# Patient Record
Sex: Male | Born: 1945 | Race: White | Hispanic: No | Marital: Married | Smoking: Former smoker
Health system: Southern US, Community
[De-identification: ages and names within clinical notes are randomized; demographics above are authoritative.]

## PROBLEM LIST (undated history)

## (undated) DIAGNOSIS — Z8619 Personal history of other infectious and parasitic diseases: Secondary | ICD-10-CM

## (undated) DIAGNOSIS — T753XXA Motion sickness, initial encounter: Secondary | ICD-10-CM

## (undated) DIAGNOSIS — H409 Unspecified glaucoma: Secondary | ICD-10-CM

## (undated) DIAGNOSIS — N400 Enlarged prostate without lower urinary tract symptoms: Secondary | ICD-10-CM

## (undated) DIAGNOSIS — B029 Zoster without complications: Secondary | ICD-10-CM

## (undated) DIAGNOSIS — F329 Major depressive disorder, single episode, unspecified: Secondary | ICD-10-CM

## (undated) DIAGNOSIS — E785 Hyperlipidemia, unspecified: Secondary | ICD-10-CM

## (undated) DIAGNOSIS — E1142 Type 2 diabetes mellitus with diabetic polyneuropathy: Secondary | ICD-10-CM

## (undated) DIAGNOSIS — F32A Depression, unspecified: Secondary | ICD-10-CM

## (undated) DIAGNOSIS — M48061 Spinal stenosis, lumbar region without neurogenic claudication: Secondary | ICD-10-CM

## (undated) DIAGNOSIS — Z8659 Personal history of other mental and behavioral disorders: Secondary | ICD-10-CM

## (undated) DIAGNOSIS — I10 Essential (primary) hypertension: Secondary | ICD-10-CM

## (undated) DIAGNOSIS — M5030 Other cervical disc degeneration, unspecified cervical region: Secondary | ICD-10-CM

## (undated) DIAGNOSIS — E669 Obesity, unspecified: Secondary | ICD-10-CM

## (undated) DIAGNOSIS — M5416 Radiculopathy, lumbar region: Secondary | ICD-10-CM

## (undated) DIAGNOSIS — G4733 Obstructive sleep apnea (adult) (pediatric): Secondary | ICD-10-CM

## (undated) DIAGNOSIS — E119 Type 2 diabetes mellitus without complications: Secondary | ICD-10-CM

## (undated) DIAGNOSIS — J309 Allergic rhinitis, unspecified: Secondary | ICD-10-CM

## (undated) DIAGNOSIS — M5104 Intervertebral disc disorders with myelopathy, thoracic region: Secondary | ICD-10-CM

## (undated) DIAGNOSIS — Z9989 Dependence on other enabling machines and devices: Secondary | ICD-10-CM

## (undated) HISTORY — PX: TONSILLECTOMY: SUR1361

---

## 2005-08-17 ENCOUNTER — Ambulatory Visit: Payer: Self-pay | Admitting: Unknown Physician Specialty

## 2009-11-05 ENCOUNTER — Ambulatory Visit: Payer: Self-pay

## 2009-12-20 ENCOUNTER — Encounter
Admission: RE | Admit: 2009-12-20 | Discharge: 2009-12-20 | Payer: Self-pay | Source: Home / Self Care | Attending: Neurosurgery | Admitting: Neurosurgery

## 2010-01-12 HISTORY — PX: POSTERIOR CERVICAL FUSION/FORAMINOTOMY: SHX5038

## 2010-01-12 HISTORY — PX: CERVICAL FUSION: SHX112

## 2010-02-19 ENCOUNTER — Ambulatory Visit
Admission: RE | Admit: 2010-02-19 | Discharge: 2010-02-19 | Disposition: A | Discharge status: Home / Self Care | Payer: BC Managed Care – PPO | Source: Ambulatory Visit | Attending: Neurosurgery | Admitting: Neurosurgery

## 2010-02-19 ENCOUNTER — Other Ambulatory Visit: Payer: Self-pay | Admitting: Neurosurgery

## 2010-02-19 DIAGNOSIS — Z9889 Other specified postprocedural states: Secondary | ICD-10-CM

## 2012-06-27 ENCOUNTER — Ambulatory Visit: Payer: Self-pay | Admitting: Unknown Physician Specialty

## 2012-06-28 LAB — PATHOLOGY REPORT

## 2014-06-14 ENCOUNTER — Ambulatory Visit: Payer: Medicare PPO | Attending: Neurology

## 2014-06-14 DIAGNOSIS — G4733 Obstructive sleep apnea (adult) (pediatric): Secondary | ICD-10-CM | POA: Insufficient documentation

## 2014-06-14 DIAGNOSIS — G4761 Periodic limb movement disorder: Secondary | ICD-10-CM | POA: Diagnosis not present

## 2014-06-27 ENCOUNTER — Other Ambulatory Visit: Payer: Self-pay | Admitting: Physical Medicine and Rehabilitation

## 2014-06-27 DIAGNOSIS — M5416 Radiculopathy, lumbar region: Secondary | ICD-10-CM

## 2014-07-06 ENCOUNTER — Ambulatory Visit
Admission: RE | Admit: 2014-07-06 | Discharge: 2014-07-06 | Disposition: A | Discharge status: Home / Self Care | Payer: Medicare PPO | Source: Ambulatory Visit | Attending: Physical Medicine and Rehabilitation | Admitting: Physical Medicine and Rehabilitation

## 2014-07-06 DIAGNOSIS — M5416 Radiculopathy, lumbar region: Secondary | ICD-10-CM | POA: Diagnosis present

## 2015-06-28 ENCOUNTER — Encounter: Payer: Self-pay | Admitting: *Deleted

## 2015-07-01 ENCOUNTER — Ambulatory Visit: Payer: Medicare Other | Admitting: Anesthesiology

## 2015-07-01 ENCOUNTER — Encounter
Admission: RE | Disposition: A | Discharge status: Home / Self Care | Payer: Self-pay | Source: Ambulatory Visit | Attending: Unknown Physician Specialty

## 2015-07-01 ENCOUNTER — Encounter: Payer: Self-pay | Admitting: Anesthesiology

## 2015-07-01 ENCOUNTER — Ambulatory Visit
Admission: RE | Admit: 2015-07-01 | Discharge: 2015-07-01 | Disposition: A | Discharge status: Home / Self Care | Payer: Medicare Other | Source: Ambulatory Visit | Attending: Unknown Physician Specialty | Admitting: Unknown Physician Specialty

## 2015-07-01 DIAGNOSIS — G4730 Sleep apnea, unspecified: Secondary | ICD-10-CM | POA: Diagnosis not present

## 2015-07-01 DIAGNOSIS — E669 Obesity, unspecified: Secondary | ICD-10-CM | POA: Diagnosis not present

## 2015-07-01 DIAGNOSIS — E785 Hyperlipidemia, unspecified: Secondary | ICD-10-CM | POA: Diagnosis not present

## 2015-07-01 DIAGNOSIS — F329 Major depressive disorder, single episode, unspecified: Secondary | ICD-10-CM | POA: Diagnosis not present

## 2015-07-01 DIAGNOSIS — E1142 Type 2 diabetes mellitus with diabetic polyneuropathy: Secondary | ICD-10-CM | POA: Diagnosis not present

## 2015-07-01 DIAGNOSIS — K621 Rectal polyp: Secondary | ICD-10-CM | POA: Diagnosis not present

## 2015-07-01 DIAGNOSIS — Z6837 Body mass index (BMI) 37.0-37.9, adult: Secondary | ICD-10-CM | POA: Insufficient documentation

## 2015-07-01 DIAGNOSIS — Z79899 Other long term (current) drug therapy: Secondary | ICD-10-CM | POA: Insufficient documentation

## 2015-07-01 DIAGNOSIS — Z7982 Long term (current) use of aspirin: Secondary | ICD-10-CM | POA: Diagnosis not present

## 2015-07-01 DIAGNOSIS — I10 Essential (primary) hypertension: Secondary | ICD-10-CM | POA: Insufficient documentation

## 2015-07-01 DIAGNOSIS — Z1211 Encounter for screening for malignant neoplasm of colon: Secondary | ICD-10-CM | POA: Insufficient documentation

## 2015-07-01 DIAGNOSIS — K648 Other hemorrhoids: Secondary | ICD-10-CM | POA: Insufficient documentation

## 2015-07-01 DIAGNOSIS — M1990 Unspecified osteoarthritis, unspecified site: Secondary | ICD-10-CM | POA: Diagnosis not present

## 2015-07-01 DIAGNOSIS — Z7984 Long term (current) use of oral hypoglycemic drugs: Secondary | ICD-10-CM | POA: Insufficient documentation

## 2015-07-01 DIAGNOSIS — D123 Benign neoplasm of transverse colon: Secondary | ICD-10-CM | POA: Diagnosis not present

## 2015-07-01 DIAGNOSIS — K5730 Diverticulosis of large intestine without perforation or abscess without bleeding: Secondary | ICD-10-CM | POA: Diagnosis not present

## 2015-07-01 HISTORY — DX: Essential (primary) hypertension: I10

## 2015-07-01 HISTORY — DX: Spinal stenosis, lumbar region without neurogenic claudication: M48.061

## 2015-07-01 HISTORY — DX: Type 2 diabetes mellitus without complications: E11.9

## 2015-07-01 HISTORY — DX: Obstructive sleep apnea (adult) (pediatric): G47.33

## 2015-07-01 HISTORY — DX: Dependence on other enabling machines and devices: Z99.89

## 2015-07-01 HISTORY — DX: Major depressive disorder, single episode, unspecified: F32.9

## 2015-07-01 HISTORY — DX: Benign prostatic hyperplasia without lower urinary tract symptoms: N40.0

## 2015-07-01 HISTORY — DX: Unspecified glaucoma: H40.9

## 2015-07-01 HISTORY — DX: Personal history of other infectious and parasitic diseases: Z86.19

## 2015-07-01 HISTORY — DX: Depression, unspecified: F32.A

## 2015-07-01 HISTORY — DX: Obesity, unspecified: E66.9

## 2015-07-01 HISTORY — DX: Type 2 diabetes mellitus with diabetic polyneuropathy: E11.42

## 2015-07-01 HISTORY — PX: COLONOSCOPY WITH PROPOFOL: SHX5780

## 2015-07-01 HISTORY — DX: Radiculopathy, lumbar region: M54.16

## 2015-07-01 HISTORY — DX: Zoster without complications: B02.9

## 2015-07-01 HISTORY — DX: Allergic rhinitis, unspecified: J30.9

## 2015-07-01 HISTORY — DX: Hyperlipidemia, unspecified: E78.5

## 2015-07-01 HISTORY — DX: Other cervical disc degeneration, unspecified cervical region: M50.30

## 2015-07-01 HISTORY — DX: Intervertebral disc disorders with myelopathy, thoracic region: M51.04

## 2015-07-01 LAB — GLUCOSE, CAPILLARY: Glucose-Capillary: 152 mg/dL — ABNORMAL HIGH (ref 65–99)

## 2015-07-01 SURGERY — COLONOSCOPY WITH PROPOFOL
Anesthesia: General

## 2015-07-01 MED ORDER — FENTANYL CITRATE (PF) 100 MCG/2ML IJ SOLN
INTRAMUSCULAR | Status: DC | PRN
  Administered 2015-07-01: 50 ug via INTRAVENOUS

## 2015-07-01 MED ORDER — PROPOFOL 500 MG/50ML IV EMUL
INTRAVENOUS | Status: DC | PRN
  Administered 2015-07-01: 75 ug/kg/min via INTRAVENOUS

## 2015-07-01 MED ORDER — PROPOFOL 10 MG/ML IV BOLUS
INTRAVENOUS | Status: DC | PRN
  Administered 2015-07-01: 30 mg via INTRAVENOUS
  Administered 2015-07-01: 20 mg via INTRAVENOUS
  Administered 2015-07-01: 10 mg via INTRAVENOUS
  Administered 2015-07-01: 20 mg via INTRAVENOUS

## 2015-07-01 MED ORDER — MIDAZOLAM HCL 5 MG/5ML IJ SOLN
INTRAMUSCULAR | Status: DC | PRN
  Administered 2015-07-01: 1 mg via INTRAVENOUS

## 2015-07-01 MED ORDER — SODIUM CHLORIDE 0.9 % IV SOLN
INTRAVENOUS | Status: DC
  Administered 2015-07-01: 1000 mL via INTRAVENOUS

## 2015-07-01 MED ORDER — LIDOCAINE HCL (PF) 2 % IJ SOLN
INTRAMUSCULAR | Status: DC | PRN
  Administered 2015-07-01: 50 mg

## 2015-07-01 MED ORDER — SODIUM CHLORIDE 0.9 % IV SOLN: INTRAVENOUS | Status: DC

## 2015-07-01 NOTE — Anesthesia Preprocedure Evaluation (Signed)
Anesthesia Evaluation  Patient identified by MRN, date of birth, ID band Patient awake    Reviewed: Allergy & Precautions, NPO status , Patient's Chart, lab work & pertinent test results, reviewed documented beta blocker date and time   Airway Mallampati: III  TM Distance: >3 FB     Dental  (+) Chipped   Pulmonary sleep apnea and Continuous Positive Airway Pressure Ventilation ,           Cardiovascular hypertension, Pt. on medications      Neuro/Psych PSYCHIATRIC DISORDERS Depression  Neuromuscular disease    GI/Hepatic   Endo/Other  diabetes, Type 2  Renal/GU      Musculoskeletal  (+) Arthritis ,   Abdominal   Peds  Hematology   Anesthesia Other Findings Obese.  Reproductive/Obstetrics                             Anesthesia Physical Anesthesia Plan  ASA: III  Anesthesia Plan: General   Post-op Pain Management:    Induction: Intravenous  Airway Management Planned: Nasal Cannula  Additional Equipment:   Intra-op Plan:   Post-operative Plan:   Informed Consent: I have reviewed the patients History and Physical, chart, labs and discussed the procedure including the risks, benefits and alternatives for the proposed anesthesia with the patient or authorized representative who has indicated his/her understanding and acceptance.     Plan Discussed with: CRNA  Anesthesia Plan Comments:         Anesthesia Quick Evaluation

## 2015-07-01 NOTE — Anesthesia Postprocedure Evaluation (Signed)
Anesthesia Post Note  Patient: Miguel White  Procedure(s) Performed: Procedure(s) (LRB): COLONOSCOPY WITH PROPOFOL (N/A)  Patient location during evaluation: Endoscopy Anesthesia Type: General Level of consciousness: awake and alert Pain management: pain level controlled Vital Signs Assessment: post-procedure vital signs reviewed and stable Respiratory status: spontaneous breathing, nonlabored ventilation, respiratory function stable and patient connected to nasal cannula oxygen Cardiovascular status: blood pressure returned to baseline and stable Postop Assessment: no signs of nausea or vomiting Anesthetic complications: no    Last Vitals:  Filed Vitals:   07/01/15 0830 07/01/15 0840  BP: 119/61 123/66  Pulse: 67 65  Temp:    Resp: 24 15    Last Pain:  Filed Vitals:   07/01/15 0843  PainSc: 2                  Doyle Kunath S

## 2015-07-01 NOTE — H&P (Signed)
Primary Care Physician:  Marcello Fennel, MD Primary Gastroenterologist:  Dr. Vira Agar  Pre-Procedure History & Physical: HPI:  Miguel White is a 70 y.o. male is here for an colonoscopy.   Past Medical History  Diagnosis Date  . Hypertension   . Hypertrophy of prostate without urinary obstruction   . Hyperlipidemia   . Depressive disorder   . Diabetes mellitus without complication ([place])   . Diabetic peripheral neuropathy ([place])   . Allergic rhinitis   . OSA on CPAP   . Lumbar radiculitis   . DDD (degenerative disc disease), cervical   . Lumbar stenosis   . HNP (herniated nucleus pulposus with myelopathy), thoracic   . Obesity   . History of chickenpox   . Glaucoma   . Shingles     History reviewed. No pertinent past surgical history.  Prior to Admission medications   Medication Sig Start Date End Date Taking? Authorizing Provider  amLODipine-benazepril (LOTREL) 10-20 MG capsule Take 1 capsule by mouth daily.   Yes Historical Provider, MD  aspirin 81 MG tablet Take 81 mg by mouth daily.   Yes Historical Provider, MD  citalopram (CELEXA) 20 MG tablet Take 20 mg by mouth daily.   Yes Historical Provider, MD  ezetimibe-simvastatin (VYTORIN) 10-20 MG tablet Take 1 tablet by mouth at bedtime.   Yes Historical Provider, MD  fluticasone (FLONASE) 50 MCG/ACT nasal spray Place 2 sprays into both nostrils daily.   Yes Historical Provider, MD  gabapentin (NEURONTIN) 600 MG tablet Take 600 mg by mouth at bedtime.   Yes Historical Provider, MD  glimepiride (AMARYL) 4 MG tablet Take 4 mg by mouth daily with breakfast.   Yes Historical Provider, MD  hydrochlorothiazide (HYDRODIURIL) 25 MG tablet Take 25 mg by mouth daily.   Yes Historical Provider, MD  latanoprost (XALATAN) 0.005 % ophthalmic solution 1 drop at bedtime.   Yes Historical Provider, MD  metformin (FORTAMET) 1000 MG (OSM) 24 hr tablet Take 1,000 mg by mouth 2 (two) times daily with a meal.   Yes Historical Provider, MD   Multiple Vitamin (MULTIVITAMIN) tablet Take 1 tablet by mouth daily.   Yes Historical Provider, MD  oxyCODONE-acetaminophen (PERCOCET/ROXICET) 5-325 MG tablet Take 1 tablet by mouth 2 (two) times daily as needed for severe pain.   Yes Historical Provider, MD  sitaGLIPtin (JANUVIA) 100 MG tablet Take 100 mg by mouth daily.   Yes Historical Provider, MD  tamsulosin (FLOMAX) 0.4 MG CAPS capsule Take 0.4 mg by mouth.   Yes Historical Provider, MD  timolol (TIMOPTIC) 0.5 % ophthalmic solution 1 drop 2 (two) times daily.   Yes Historical Provider, MD  triamcinolone cream (KENALOG) 0.1 % Apply 1 application topically 4 (four) times daily.   Yes Historical Provider, MD    Allergies as of 06/13/2015  . (Not on File)    History reviewed. No pertinent family history.  Social History   Social History  . Marital Status: Unknown    Spouse Name: N/A  . Number of Children: N/A  . Years of Education: N/A   Occupational History  . Not on file.   Social History Main Topics  . Smoking status: Not on file  . Smokeless tobacco: Not on file  . Alcohol Use: Not on file  . Drug Use: Not on file  . Sexual Activity: Not on file   Other Topics Concern  . Not on file   Social History Narrative    Review of Systems: See HPI, otherwise negative  ROS  Physical Exam: BP 135/65 mmHg  Pulse 64  Temp(Src) 97.6 F (36.4 C) (Tympanic)  Resp 16  Ht 5\' 6"  (1.676 m)  Wt 106.595 kg (235 lb)  BMI 37.95 kg/m2  SpO2 100% General:   Alert,  pleasant and cooperative in NAD Head:  Normocephalic and atraumatic. Neck:  Supple; no masses or thyromegaly. Lungs:  Clear throughout to auscultation.    Heart:  Regular rate and rhythm. Abdomen:  Soft, nontender and nondistended. Normal bowel sounds, without guarding, and without rebound.   Neurologic:  Alert and  oriented x4;  grossly normal neurologically.  Impression/Plan: Miguel White is here for an colonoscopy to be performed for [facility] colon polyps  Risks,  benefits, limitations, and alternatives regarding  colonoscopy have been reviewed with the patient.  Questions have been answered.  All parties agreeable.   Gaylyn Cheers, MD  07/01/2015, 7:28 AM

## 2015-07-01 NOTE — Op Note (Addendum)
[facility] Gastroenterology Patient Name: Miguel White Procedure Date: 07/01/2015 7:35 AM MRN: [id number] Account #: [id number] Date of Birth: 01-24-1945 Admit Type: Outpatient Age: 70 Room: [facility] ENDO ROOM 1 Gender: Male Note Status: Finalized Procedure:            Colonoscopy Indications:          High risk colon cancer surveillance: Personal history                        of colonic polyps Providers:            Manya Silvas, MD Referring MD:         Marga Hoots NP (Referring MD) Medicines:            Propofol per Anesthesia Complications:        No immediate complications. Procedure:            Pre-Anesthesia Assessment:                       - After reviewing the risks and benefits, the patient                        was deemed in satisfactory condition to undergo the                        procedure.                       After obtaining informed consent, the colonoscope was                        passed under direct vision. Throughout the procedure,                        the patient's blood pressure, pulse, and oxygen                        saturations were monitored continuously. The                        Colonoscope was introduced through the anus and                        advanced to the the cecum, identified by appendiceal                        orifice and ileocecal valve. The colonoscopy was                        performed without difficulty. The patient tolerated the                        procedure well. The quality of the bowel preparation                        was good. Findings:      Two sessile polyps were found in the rectum and transverse colon. The       polyps were diminutive in size. These polyps were removed with a jumbo       cold forceps. Resection and retrieval were complete.  Multiple small-mouthed diverticula were found in the sigmoid colon.      Internal hemorrhoids were found during endoscopy. The hemorrhoids  were       small and Grade I (internal hemorrhoids that do not prolapse).      The exam was otherwise without abnormality. Impression:           - Two diminutive polyps in the rectum and in the                        transverse colon, removed with a jumbo cold forceps.                        Resected and retrieved.                       - Diverticulosis in the sigmoid colon.                       - Internal hemorrhoids.                       - The examination was otherwise normal. Recommendation:       - Await pathology results. Manya Silvas, MD 07/01/2015 8:10:06 AM This report has been signed electronically. Number of Addenda: 0 Note Initiated On: 07/01/2015 7:35 AM Scope Withdrawal Time: 0 hours 20 minutes 30 seconds  Total Procedure Duration: 0 hours 28 minutes 59 seconds       [facility]

## 2015-07-01 NOTE — Transfer of Care (Signed)
Immediate Anesthesia Transfer of Care Note  Patient: Miguel White  Procedure(s) Performed: Procedure(s): COLONOSCOPY WITH PROPOFOL (N/A)  Patient Location: PACU  Anesthesia Type:General  Level of Consciousness: sedated  Airway & Oxygen Therapy: Patient Spontanous Breathing and Patient connected to nasal cannula oxygen  Post-op Assessment: Report given to RN and Post -op Vital signs reviewed and stable  Post vital signs: Reviewed and stable  Last Vitals:  Filed Vitals:   07/01/15 0700  BP: 135/65  Pulse: 64  Temp: 36.4 C  Resp: 16    Last Pain:  Filed Vitals:   07/01/15 0703  PainSc: 2          Complications: No apparent anesthesia complications

## 2015-07-02 ENCOUNTER — Encounter: Payer: Self-pay | Admitting: Unknown Physician Specialty

## 2015-07-02 LAB — SURGICAL PATHOLOGY

## 2015-10-31 ENCOUNTER — Other Ambulatory Visit: Payer: Self-pay | Admitting: Family Medicine

## 2015-10-31 DIAGNOSIS — M5416 Radiculopathy, lumbar region: Secondary | ICD-10-CM

## 2015-11-19 ENCOUNTER — Ambulatory Visit: Admission: RE | Admit: 2015-11-19 | Payer: Medicare Other | Source: Ambulatory Visit

## 2015-11-21 ENCOUNTER — Ambulatory Visit
Admission: RE | Admit: 2015-11-21 | Discharge: 2015-11-21 | Disposition: A | Discharge status: Home / Self Care | Payer: Medicare Other | Source: Ambulatory Visit | Attending: Family Medicine | Admitting: Family Medicine

## 2015-11-21 DIAGNOSIS — M5416 Radiculopathy, lumbar region: Secondary | ICD-10-CM | POA: Insufficient documentation

## 2015-11-21 DIAGNOSIS — M4306 Spondylolysis, lumbar region: Secondary | ICD-10-CM | POA: Insufficient documentation

## 2015-11-21 DIAGNOSIS — M48061 Spinal stenosis, lumbar region without neurogenic claudication: Secondary | ICD-10-CM | POA: Diagnosis not present

## 2015-11-21 DIAGNOSIS — M5126 Other intervertebral disc displacement, lumbar region: Secondary | ICD-10-CM | POA: Diagnosis not present

## 2015-12-10 ENCOUNTER — Other Ambulatory Visit: Payer: Self-pay | Admitting: Neurosurgery

## 2016-01-03 NOTE — Pre-Procedure Instructions (Signed)
Miguel White  01/03/2016      MEDICAL [address] [place], [name] - [name] [address] Phone: [phone] Fax: [phone]    Your procedure is scheduled on Jan. 8  Report to [facility] at 900  Call this number if you have problems the morning of surgery:  [phone]   Remember:  Do not eat food or drink liquids after midnight.  Take these medicines the morning of surgery with A SIP OF WATER Citalopram (Celexa), Gabapentin (Neurontin), Tamsulosin (Flomax)  Stop taking aspirin, BC's, Goody's, Herbal medications, Fish Oil, Ibuprofen, Advil, Motrin, Aleve, vitamins    How to Manage Your Diabetes Before and After Surgery  Why is it important to control my blood sugar before and after surgery? . Improving blood sugar levels before and after surgery helps healing and can limit problems. . A way of improving blood sugar control is eating a healthy diet by: o  Eating less sugar and carbohydrates o  Increasing activity/exercise o  Talking with your doctor about reaching your blood sugar goals . High blood sugars (greater than 180 mg/dL) can raise your risk of infections and slow your recovery, so you will need to focus on controlling your diabetes during the weeks before surgery. . Make sure that the doctor who takes care of your diabetes knows about your planned surgery including the date and location.  How do I manage my blood sugar before surgery? . Check your blood sugar at least 4 times a day, starting 2 days before surgery, to make sure that the level is not too high or low. o Check your blood sugar the morning of your surgery when you wake up and every 2 hours until you get to the Short Stay unit. . If your blood sugar is less than 70 mg/dL, you will need to treat for low blood sugar: o Do not take insulin. o Treat a low blood sugar (less than 70 mg/dL) with  cup of clear juice (cranberry or apple), 4  glucose tablets, OR glucose gel. o Recheck blood sugar in 15 minutes after treatment (to make sure it is greater than 70 mg/dL). If your blood sugar is not greater than 70 mg/dL on recheck, call [phone] for further instructions. . Report your blood sugar to the short stay nurse when you get to Short Stay.  . If you are admitted to the hospital after surgery: o Your blood sugar will be checked by the staff and you will probably be given insulin after surgery (instead of oral diabetes medicines) to make sure you have good blood sugar levels. o The goal for blood sugar control after surgery is 80-180 mg/dL.              WHAT DO I DO ABOUT MY DIABETES MEDICATION?   [name] Do not take oral diabetes medicines (pills) the morning of surgery. Glimepiride (Amaryl), metformin (Fortamet), and Sitaglptin (Januvia)  . THE NIGHT BEFORE SURGERY, take 37  units of Lantus insulin.       [name] HE MORNING OF SURGERY, take _____________ units of __________insulin.  . The day of surgery, do not take other diabetes injectables, including Byetta (exenatide), Bydureon (exenatide ER), Victoza (liraglutide), or Trulicity (dulaglutide).  . If your CBG is greater than 220 mg/dL, you may take  of your sliding scale (correction) dose of insulin.  Other Instructions:          Patient Signature:  Date:  Nurse Signature:  Date:   Reviewed and Endorsed by [facility] Patient Education Committee, August 2015  Do not wear jewelry, make-up or nail polish.  Do not wear lotions, powders, or perfumes, or deoderant.  Do not shave 48 hours prior to surgery.  Men may shave face and neck.  Do not bring valuables to the hospital.  [facility] is not responsible for any belongings or valuables.  Contacts, dentures or bridgework may not be worn into surgery.  Leave your suitcase in the car.  After surgery it may be brought to your room.  For patients admitted to the hospital, discharge time will be determined by  your treatment team.  Patients discharged the day of surgery will not be allowed to drive home.   Special instructions:  [name] - Preparing for Surgery  Before surgery, you can play an important role.  Because skin is not sterile, your skin needs to be as free of germs as possible.  You can reduce the number of germs on you skin by washing with CHG (chlorahexidine gluconate) soap before surgery.  CHG is an antiseptic cleaner which kills germs and bonds with the skin to continue killing germs even after washing.  Please DO NOT use if you have an allergy to CHG or antibacterial soaps.  If your skin becomes reddened/irritated stop using the CHG and inform your nurse when you arrive at Short Stay.  Do not shave (including legs and underarms) for at least 48 hours prior to the first CHG shower.  You may shave your face.  Please follow these instructions carefully:   1.  Shower with CHG Soap the night before surgery and the    morning of Surgery.  2.  If you choose to wash your hair, wash your hair first as usual with your  normal shampoo.  3.  After you shampoo, rinse your hair and body thoroughly to remove the   Shampoo.  4.  Use CHG as you would any other liquid soap.  You can apply chg directly to the skin and wash gently with scrungie or a clean washcloth.  5.  Apply the CHG Soap to your body ONLY FROM THE NECK DOWN.        Do not use on open wounds or open sores.  Avoid contact with your eyes,       ears, mouth and genitals (private parts).  Wash genitals (private parts)       with your normal soap.  6.  Wash thoroughly, paying special attention to the area where your surgery will be performed.  7.  Thoroughly rinse your body with warm water from the neck down.  8.  DO NOT shower/wash with your normal soap after using and rinsing off  the CHG Soap.  9.  Pat yourself dry with a clean towel.            10.  Wear clean pajamas.            11.  Place clean sheets on your bed the night of  your first shower and do not sleep with pets.  Day of Surgery  Do not apply any lotions/deoderants the morning of surgery.  Please wear clean clothes to the hospital/surgery center.  '   Please read over the following fact sheets that you were given. Pain Booklet, MRSA Information and Surgical Site Infection Prevention

## 2016-01-07 ENCOUNTER — Encounter (HOSPITAL_COMMUNITY): Payer: Self-pay

## 2016-01-07 ENCOUNTER — Encounter (HOSPITAL_COMMUNITY)
Admission: RE | Admit: 2016-01-07 | Discharge: 2016-01-07 | Disposition: A | Discharge status: Home / Self Care | Payer: Medicare Other | Source: Ambulatory Visit | Attending: Neurosurgery | Admitting: Neurosurgery

## 2016-01-07 DIAGNOSIS — E669 Obesity, unspecified: Secondary | ICD-10-CM | POA: Diagnosis not present

## 2016-01-07 DIAGNOSIS — M5124 Other intervertebral disc displacement, thoracic region: Secondary | ICD-10-CM | POA: Diagnosis not present

## 2016-01-07 DIAGNOSIS — E785 Hyperlipidemia, unspecified: Secondary | ICD-10-CM | POA: Insufficient documentation

## 2016-01-07 DIAGNOSIS — G4733 Obstructive sleep apnea (adult) (pediatric): Secondary | ICD-10-CM | POA: Insufficient documentation

## 2016-01-07 DIAGNOSIS — Z01812 Encounter for preprocedural laboratory examination: Secondary | ICD-10-CM | POA: Insufficient documentation

## 2016-01-07 DIAGNOSIS — I10 Essential (primary) hypertension: Secondary | ICD-10-CM | POA: Insufficient documentation

## 2016-01-07 DIAGNOSIS — E1142 Type 2 diabetes mellitus with diabetic polyneuropathy: Secondary | ICD-10-CM | POA: Diagnosis not present

## 2016-01-07 DIAGNOSIS — H409 Unspecified glaucoma: Secondary | ICD-10-CM | POA: Diagnosis not present

## 2016-01-07 DIAGNOSIS — M5030 Other cervical disc degeneration, unspecified cervical region: Secondary | ICD-10-CM | POA: Diagnosis not present

## 2016-01-07 DIAGNOSIS — M48061 Spinal stenosis, lumbar region without neurogenic claudication: Secondary | ICD-10-CM | POA: Diagnosis not present

## 2016-01-07 DIAGNOSIS — Z01810 Encounter for preprocedural cardiovascular examination: Secondary | ICD-10-CM | POA: Insufficient documentation

## 2016-01-07 LAB — BASIC METABOLIC PANEL
Anion gap: 9 (ref 5–15)
BUN: 11 mg/dL (ref 6–20)
CHLORIDE: 100 mmol/L — AB (ref 101–111)
CO2: 27 mmol/L (ref 22–32)
Calcium: 9.5 mg/dL (ref 8.9–10.3)
Creatinine, Ser: 0.66 mg/dL (ref 0.61–1.24)
GFR calc non Af Amer: >60 mL/min (ref >60)
Glucose, Bld: 263 mg/dL — ABNORMAL HIGH (ref 65–99)
POTASSIUM: 4 mmol/L (ref 3.5–5.1)
SODIUM: 136 mmol/L (ref 135–145)

## 2016-01-07 LAB — TYPE AND SCREEN
ABO/RH(D): O POS
ANTIBODY SCREEN: NEGATIVE

## 2016-01-07 LAB — SURGICAL PCR SCREEN
MRSA, PCR: NEGATIVE
Staphylococcus aureus: NEGATIVE

## 2016-01-07 LAB — CBC
HCT: 44.4 % (ref 39.0–52.0)
Hemoglobin: 15.8 g/dL (ref 13.0–17.0)
MCH: 32.7 pg (ref 26.0–34.0)
MCHC: 35.6 g/dL (ref 30.0–36.0)
MCV: 91.9 fL (ref 78.0–100.0)
PLATELETS: 133 10*3/uL — AB (ref 150–400)
RBC: 4.83 MIL/uL (ref 4.22–5.81)
RDW: 13 % (ref 11.5–15.5)
WBC: 5.2 10*3/uL (ref 4.0–10.5)

## 2016-01-07 LAB — HEMOGLOBIN A1C
Hgb A1c MFr Bld: 8.3 % — ABNORMAL HIGH (ref 4.8–5.6)
Mean Plasma Glucose: 192 mg/dL

## 2016-01-07 LAB — GLUCOSE, CAPILLARY: Glucose-Capillary: 252 mg/dL — ABNORMAL HIGH (ref 65–99)

## 2016-01-07 LAB — ABO/RH: ABO/RH(D): O POS

## 2016-01-07 NOTE — Progress Notes (Addendum)
PCP Dr. Whitney Post Denies having seen a cardiologist; has never had an echo or cardiac cath. States he may have had a stress test greater than 10 years ago.  CBG 252 at PAT appt. States fasting CBG normally runs less than 150s. Patient does have sleep apnea and was instructed to bring mask.

## 2016-01-08 NOTE — Progress Notes (Signed)
Anesthesia chart review: Patient is a 70 year old male scheduled for decompression laminectomy with pedicle screws and rods, placement of interbody prosthesis posterior lateral arthrodesis L4-5 on 01/20/2016 by Dr. Arnoldo Morale.  History includes former smoker, HTN, BPH, HLD, DM2 with peripheral neuropathy, OSA (with CPAP), glaucoma, depressive disorder, tonsillectomy, c-spine laminectomy/fusion '12. BMI is consistent with obesity.  PCP is Dr. Whitney Post.  Meds include amlodipine-benazepril, aspirin 81 mg, Zyrtec, Celexa, Vytorin, Flonase, Neurontin, Amaryl, HCTZ, Lantus, Xalatan ophthalmic, metformin, Januvia, Flomax, Timoptic ophthalmic.  BP (!) 161/71   Pulse 78   Temp 36.5 C   Resp 20   Ht 5\' 6"  (1.676 m)   Wt 250 lb 11.2 oz (113.7 kg)   SpO2 97%   BMI 40.46 kg/m   EKG 01/07/16: NSR.  Preoperative labs noted. Cr 0.66, non-fasting glucose 263, H/H 15.8/44.4, PLT 133. T&S done. A1c 8.3, consistent with average glucose of 192 (comparison A1c 7.1 on 08/13/15 in [place]). He reports home fasting CBG < 150.   If no acute changes then I anticipate that he can proceed as planned. Since A1c is > 8, I will notify DM coordinator of planned admission.  [facility] Short Stay Center/Anesthesiology Phone [phone] 01/08/2016 1:17 PM

## 2016-01-19 MED ORDER — CEFAZOLIN SODIUM-DEXTROSE 2-4 GM/100ML-% IV SOLN
2.0000 g | INTRAVENOUS | Status: AC
  Administered 2016-01-20 (×2): 2 g via INTRAVENOUS
  Filled 2016-01-19: qty 100

## 2016-01-20 ENCOUNTER — Inpatient Hospital Stay (HOSPITAL_COMMUNITY): Payer: Medicare Other

## 2016-01-20 ENCOUNTER — Encounter (HOSPITAL_COMMUNITY): Payer: Self-pay | Admitting: Certified Registered Nurse Anesthetist

## 2016-01-20 ENCOUNTER — Inpatient Hospital Stay (HOSPITAL_COMMUNITY): Payer: Medicare Other | Admitting: Vascular Surgery

## 2016-01-20 ENCOUNTER — Inpatient Hospital Stay (HOSPITAL_COMMUNITY)
Admission: RE | Admit: 2016-01-20 | Discharge: 2016-01-21 | DRG: 455 | Disposition: A | Discharge status: Home / Self Care | Payer: Medicare Other | Source: Ambulatory Visit | Attending: Neurosurgery | Admitting: Neurosurgery

## 2016-01-20 ENCOUNTER — Encounter (HOSPITAL_COMMUNITY)
Admission: RE | Disposition: A | Discharge status: Home / Self Care | Payer: Self-pay | Source: Ambulatory Visit | Attending: Neurosurgery

## 2016-01-20 DIAGNOSIS — M48062 Spinal stenosis, lumbar region with neurogenic claudication: Principal | ICD-10-CM | POA: Diagnosis present

## 2016-01-20 DIAGNOSIS — E785 Hyperlipidemia, unspecified: Secondary | ICD-10-CM | POA: Diagnosis present

## 2016-01-20 DIAGNOSIS — M4316 Spondylolisthesis, lumbar region: Secondary | ICD-10-CM | POA: Diagnosis present

## 2016-01-20 DIAGNOSIS — Z7982 Long term (current) use of aspirin: Secondary | ICD-10-CM

## 2016-01-20 DIAGNOSIS — Z888 Allergy status to other drugs, medicaments and biological substances status: Secondary | ICD-10-CM | POA: Diagnosis not present

## 2016-01-20 DIAGNOSIS — Z87891 Personal history of nicotine dependence: Secondary | ICD-10-CM

## 2016-01-20 DIAGNOSIS — Z794 Long term (current) use of insulin: Secondary | ICD-10-CM

## 2016-01-20 DIAGNOSIS — F329 Major depressive disorder, single episode, unspecified: Secondary | ICD-10-CM | POA: Diagnosis present

## 2016-01-20 DIAGNOSIS — M5416 Radiculopathy, lumbar region: Secondary | ICD-10-CM | POA: Diagnosis present

## 2016-01-20 DIAGNOSIS — H409 Unspecified glaucoma: Secondary | ICD-10-CM | POA: Diagnosis present

## 2016-01-20 DIAGNOSIS — E1142 Type 2 diabetes mellitus with diabetic polyneuropathy: Secondary | ICD-10-CM | POA: Diagnosis present

## 2016-01-20 DIAGNOSIS — I10 Essential (primary) hypertension: Secondary | ICD-10-CM | POA: Diagnosis present

## 2016-01-20 DIAGNOSIS — N400 Enlarged prostate without lower urinary tract symptoms: Secondary | ICD-10-CM | POA: Diagnosis present

## 2016-01-20 DIAGNOSIS — Z7951 Long term (current) use of inhaled steroids: Secondary | ICD-10-CM | POA: Diagnosis not present

## 2016-01-20 DIAGNOSIS — G4733 Obstructive sleep apnea (adult) (pediatric): Secondary | ICD-10-CM | POA: Diagnosis present

## 2016-01-20 DIAGNOSIS — Z79899 Other long term (current) drug therapy: Secondary | ICD-10-CM | POA: Diagnosis not present

## 2016-01-20 DIAGNOSIS — Z981 Arthrodesis status: Secondary | ICD-10-CM | POA: Diagnosis not present

## 2016-01-20 DIAGNOSIS — Z419 Encounter for procedure for purposes other than remedying health state, unspecified: Secondary | ICD-10-CM

## 2016-01-20 HISTORY — PX: DECOMPRESSIVE LUMBAR LAMINECTOMY LEVEL 4: SHX5794

## 2016-01-20 LAB — GLUCOSE, CAPILLARY
GLUCOSE-CAPILLARY: 254 mg/dL — AB (ref 65–99)
GLUCOSE-CAPILLARY: 174 mg/dL — AB (ref 65–99)
Glucose-Capillary: 215 mg/dL — ABNORMAL HIGH (ref 65–99)
Glucose-Capillary: 287 mg/dL — ABNORMAL HIGH (ref 65–99)

## 2016-01-20 SURGERY — POSTERIOR LUMBAR FUSION 1 LEVEL
Anesthesia: General | Site: Spine Lumbar

## 2016-01-20 MED ORDER — 0.9 % SODIUM CHLORIDE (POUR BTL) OPTIME
TOPICAL | Status: DC | PRN
  Administered 2016-01-20: 1000 mL

## 2016-01-20 MED ORDER — MIDAZOLAM HCL 2 MG/2ML IJ SOLN
INTRAMUSCULAR | Status: AC
  Filled 2016-01-20: qty 2

## 2016-01-20 MED ORDER — PHENYLEPHRINE HCL 10 MG/ML IJ SOLN
INTRAVENOUS | Status: DC | PRN
  Administered 2016-01-20: 30 ug/min via INTRAVENOUS

## 2016-01-20 MED ORDER — METFORMIN HCL ER 500 MG PO TB24
1000.0000 mg | ORAL_TABLET | Freq: Two times a day (BID) | ORAL | Status: DC
Start: 2016-01-21 — End: 2016-01-21
  Administered 2016-01-20 – 2016-01-21 (×2): 1000 mg via ORAL
  Filled 2016-01-20 (×2): qty 2

## 2016-01-20 MED ORDER — MIDAZOLAM HCL 5 MG/5ML IJ SOLN
INTRAMUSCULAR | Status: DC | PRN
  Administered 2016-01-20: 1 mg via INTRAVENOUS

## 2016-01-20 MED ORDER — VANCOMYCIN HCL 1000 MG IV SOLR
INTRAVENOUS | Status: DC | PRN
  Administered 2016-01-20: 1000 mg via TOPICAL

## 2016-01-20 MED ORDER — GABAPENTIN 300 MG PO CAPS
300.0000 mg | ORAL_CAPSULE | Freq: Three times a day (TID) | ORAL | Status: DC
  Administered 2016-01-20 – 2016-01-21 (×2): 300 mg via ORAL
  Filled 2016-01-20 (×2): qty 1

## 2016-01-20 MED ORDER — HYDROCHLOROTHIAZIDE 25 MG PO TABS
25.0000 mg | ORAL_TABLET | Freq: Every day | ORAL | Status: DC
  Administered 2016-01-21: 25 mg via ORAL
  Filled 2016-01-20: qty 1

## 2016-01-20 MED ORDER — MENTHOL 3 MG MT LOZG: 1.0000 | LOZENGE | OROMUCOSAL | Status: DC | PRN

## 2016-01-20 MED ORDER — EPHEDRINE SULFATE 50 MG/ML IJ SOLN
INTRAMUSCULAR | Status: DC | PRN
  Administered 2016-01-20: 5 mg via INTRAVENOUS
  Administered 2016-01-20: 2.5 mg via INTRAVENOUS
  Administered 2016-01-20: 10 mg via INTRAVENOUS
  Administered 2016-01-20: 2.5 mg via INTRAVENOUS
  Administered 2016-01-20: 5 mg via INTRAVENOUS

## 2016-01-20 MED ORDER — ROCURONIUM BROMIDE 50 MG/5ML IV SOSY
PREFILLED_SYRINGE | INTRAVENOUS | Status: AC
  Filled 2016-01-20: qty 15

## 2016-01-20 MED ORDER — LORATADINE 10 MG PO TABS
10.0000 mg | ORAL_TABLET | Freq: Every day | ORAL | Status: DC
Start: 2016-01-21 — End: 2016-01-21
  Administered 2016-01-21: 10 mg via ORAL
  Filled 2016-01-20: qty 1

## 2016-01-20 MED ORDER — BUPIVACAINE HCL (PF) 0.5 % IJ SOLN
INTRAMUSCULAR | Status: AC
  Filled 2016-01-20: qty 30

## 2016-01-20 MED ORDER — CYCLOBENZAPRINE HCL 10 MG PO TABS
10.0000 mg | ORAL_TABLET | Freq: Three times a day (TID) | ORAL | Status: DC | PRN
  Administered 2016-01-20 – 2016-01-21 (×2): 10 mg via ORAL
  Filled 2016-01-20 (×2): qty 1

## 2016-01-20 MED ORDER — LIDOCAINE HCL (CARDIAC) 20 MG/ML IV SOLN
INTRAVENOUS | Status: DC | PRN
  Administered 2016-01-20: 40 mg via INTRAVENOUS

## 2016-01-20 MED ORDER — FENTANYL CITRATE (PF) 100 MCG/2ML IJ SOLN
INTRAMUSCULAR | Status: AC
Start: 2016-01-20 — End: 2016-01-20
  Filled 2016-01-20: qty 4

## 2016-01-20 MED ORDER — SODIUM CHLORIDE 0.9 % IR SOLN
Status: DC | PRN
  Administered 2016-01-20: 14:00:00

## 2016-01-20 MED ORDER — ONDANSETRON HCL 4 MG/2ML IJ SOLN: 4.0000 mg | INTRAMUSCULAR | Status: DC | PRN

## 2016-01-20 MED ORDER — AMLODIPINE BESY-BENAZEPRIL HCL 10-20 MG PO CAPS: 1.0000 | ORAL_CAPSULE | Freq: Every day | ORAL | Status: DC

## 2016-01-20 MED ORDER — LIDOCAINE-EPINEPHRINE (PF) 2 %-1:200000 IJ SOLN
INTRAMUSCULAR | Status: DC | PRN
  Administered 2016-01-20: 10 mL

## 2016-01-20 MED ORDER — PROPOFOL 10 MG/ML IV BOLUS
INTRAVENOUS | Status: DC | PRN
  Administered 2016-01-20: 200 mg via INTRAVENOUS

## 2016-01-20 MED ORDER — INSULIN ASPART 100 UNIT/ML ~~LOC~~ SOLN: 8.0000 [IU] | Freq: Once | SUBCUTANEOUS | Status: DC

## 2016-01-20 MED ORDER — PHENYLEPHRINE HCL 10 MG/ML IJ SOLN
INTRAMUSCULAR | Status: DC | PRN
  Administered 2016-01-20: 80 ug via INTRAVENOUS
  Administered 2016-01-20: 120 ug via INTRAVENOUS
  Administered 2016-01-20 (×2): 80 ug via INTRAVENOUS

## 2016-01-20 MED ORDER — BUPIVACAINE LIPOSOME 1.3 % IJ SUSP
20.0000 mL | INTRAMUSCULAR | Status: AC
  Administered 2016-01-20: 20 mL
  Filled 2016-01-20: qty 20

## 2016-01-20 MED ORDER — TIMOLOL MALEATE 0.5 % OP SOLN
1.0000 [drp] | Freq: Every evening | OPHTHALMIC | Status: DC
  Administered 2016-01-20: 1 [drp] via OPHTHALMIC
  Filled 2016-01-20: qty 5

## 2016-01-20 MED ORDER — SUGAMMADEX SODIUM 200 MG/2ML IV SOLN
INTRAVENOUS | Status: AC
  Filled 2016-01-20: qty 4

## 2016-01-20 MED ORDER — THROMBIN 20000 UNITS EX SOLR
CUTANEOUS | Status: AC
  Filled 2016-01-20: qty 20000

## 2016-01-20 MED ORDER — PROPOFOL 10 MG/ML IV BOLUS
INTRAVENOUS | Status: AC
  Filled 2016-01-20: qty 40

## 2016-01-20 MED ORDER — ACETAMINOPHEN 325 MG PO TABS: 650.0000 mg | ORAL_TABLET | ORAL | Status: DC | PRN

## 2016-01-20 MED ORDER — ONDANSETRON HCL 4 MG/2ML IJ SOLN
INTRAMUSCULAR | Status: DC | PRN
  Administered 2016-01-20: 4 mg via INTRAVENOUS

## 2016-01-20 MED ORDER — MORPHINE SULFATE (PF) 4 MG/ML IV SOLN: 1.0000 mg | INTRAVENOUS | Status: DC | PRN

## 2016-01-20 MED ORDER — PHENOL 1.4 % MT LIQD: 1.0000 | OROMUCOSAL | Status: DC | PRN

## 2016-01-20 MED ORDER — THROMBIN 20000 UNITS EX SOLR
CUTANEOUS | Status: DC | PRN
  Administered 2016-01-20: 14:00:00 via TOPICAL

## 2016-01-20 MED ORDER — ONDANSETRON HCL 4 MG/2ML IJ SOLN
INTRAMUSCULAR | Status: AC
  Filled 2016-01-20: qty 4

## 2016-01-20 MED ORDER — DEXAMETHASONE SODIUM PHOSPHATE 10 MG/ML IJ SOLN
INTRAMUSCULAR | Status: AC
  Filled 2016-01-20: qty 2

## 2016-01-20 MED ORDER — HYDROCODONE-ACETAMINOPHEN 5-325 MG PO TABS: 1.0000 | ORAL_TABLET | ORAL | Status: DC | PRN

## 2016-01-20 MED ORDER — FLUTICASONE PROPIONATE 50 MCG/ACT NA SUSP
2.0000 | Freq: Every evening | NASAL | Status: DC
  Administered 2016-01-20: 2 via NASAL
  Filled 2016-01-20: qty 16

## 2016-01-20 MED ORDER — BISACODYL 10 MG RE SUPP: 10.0000 mg | Freq: Every day | RECTAL | Status: DC | PRN

## 2016-01-20 MED ORDER — SODIUM CHLORIDE 0.9% FLUSH: 3.0000 mL | INTRAVENOUS | Status: DC | PRN

## 2016-01-20 MED ORDER — VANCOMYCIN HCL 1000 MG IV SOLR
INTRAVENOUS | Status: AC
  Filled 2016-01-20: qty 1000

## 2016-01-20 MED ORDER — BACITRACIN ZINC 500 UNIT/GM EX OINT
TOPICAL_OINTMENT | CUTANEOUS | Status: AC
  Filled 2016-01-20: qty 28.35

## 2016-01-20 MED ORDER — CHLORHEXIDINE GLUCONATE CLOTH 2 % EX PADS: 6.0000 | MEDICATED_PAD | Freq: Once | CUTANEOUS | Status: DC

## 2016-01-20 MED ORDER — DOCUSATE SODIUM 100 MG PO CAPS
100.0000 mg | ORAL_CAPSULE | Freq: Two times a day (BID) | ORAL | Status: DC
  Administered 2016-01-20 – 2016-01-21 (×2): 100 mg via ORAL
  Filled 2016-01-20 (×2): qty 1

## 2016-01-20 MED ORDER — ACETAMINOPHEN 650 MG RE SUPP: 650.0000 mg | RECTAL | Status: DC | PRN

## 2016-01-20 MED ORDER — FENTANYL CITRATE (PF) 100 MCG/2ML IJ SOLN
25.0000 ug | INTRAMUSCULAR | Status: DC | PRN
  Administered 2016-01-20: 25 ug via INTRAVENOUS
  Administered 2016-01-20: 50 ug via INTRAVENOUS

## 2016-01-20 MED ORDER — BACITRACIN ZINC 500 UNIT/GM EX OINT
TOPICAL_OINTMENT | CUTANEOUS | Status: DC | PRN
  Administered 2016-01-20: 1 via TOPICAL

## 2016-01-20 MED ORDER — INSULIN ASPART 100 UNIT/ML ~~LOC~~ SOLN
SUBCUTANEOUS | Status: AC
  Administered 2016-01-20: 8 [IU] via SUBCUTANEOUS
  Filled 2016-01-20: qty 1

## 2016-01-20 MED ORDER — INSULIN ASPART 100 UNIT/ML ~~LOC~~ SOLN: 0.0000 [IU] | SUBCUTANEOUS | Status: DC

## 2016-01-20 MED ORDER — GLIMEPIRIDE 2 MG PO TABS
4.0000 mg | ORAL_TABLET | Freq: Every day | ORAL | Status: DC
  Administered 2016-01-21: 4 mg via ORAL
  Filled 2016-01-20: qty 2

## 2016-01-20 MED ORDER — LINAGLIPTIN 5 MG PO TABS
5.0000 mg | ORAL_TABLET | Freq: Every day | ORAL | Status: DC
  Administered 2016-01-21: 5 mg via ORAL
  Filled 2016-01-20: qty 1

## 2016-01-20 MED ORDER — ROCURONIUM BROMIDE 100 MG/10ML IV SOLN
INTRAVENOUS | Status: DC | PRN
  Administered 2016-01-20: 50 mg via INTRAVENOUS
  Administered 2016-01-20: 20 mg via INTRAVENOUS
  Administered 2016-01-20 (×5): 10 mg via INTRAVENOUS

## 2016-01-20 MED ORDER — OXYCODONE HCL 5 MG/5ML PO SOLN: 5.0000 mg | Freq: Once | ORAL | Status: DC | PRN

## 2016-01-20 MED ORDER — FENTANYL CITRATE (PF) 100 MCG/2ML IJ SOLN
INTRAMUSCULAR | Status: DC | PRN
  Administered 2016-01-20: 25 ug via INTRAVENOUS
  Administered 2016-01-20 (×2): 50 ug via INTRAVENOUS
  Administered 2016-01-20: 25 ug via INTRAVENOUS
  Administered 2016-01-20: 50 ug via INTRAVENOUS
  Administered 2016-01-20: 25 ug via INTRAVENOUS

## 2016-01-20 MED ORDER — ONDANSETRON HCL 4 MG/2ML IJ SOLN: 4.0000 mg | Freq: Once | INTRAMUSCULAR | Status: DC | PRN

## 2016-01-20 MED ORDER — LIDOCAINE 2% (20 MG/ML) 5 ML SYRINGE
INTRAMUSCULAR | Status: AC
  Filled 2016-01-20: qty 10

## 2016-01-20 MED ORDER — FENTANYL CITRATE (PF) 100 MCG/2ML IJ SOLN
INTRAMUSCULAR | Status: AC
  Filled 2016-01-20: qty 2

## 2016-01-20 MED ORDER — TAMSULOSIN HCL 0.4 MG PO CAPS: 0.4000 mg | ORAL_CAPSULE | ORAL | Status: DC

## 2016-01-20 MED ORDER — LIDOCAINE-EPINEPHRINE (PF) 2 %-1:200000 IJ SOLN
INTRAMUSCULAR | Status: AC
  Filled 2016-01-20: qty 20

## 2016-01-20 MED ORDER — BENAZEPRIL HCL 20 MG PO TABS
20.0000 mg | ORAL_TABLET | Freq: Every day | ORAL | Status: DC
  Administered 2016-01-21: 20 mg via ORAL
  Filled 2016-01-20: qty 1

## 2016-01-20 MED ORDER — SODIUM CHLORIDE 0.9% FLUSH: 3.0000 mL | Freq: Two times a day (BID) | INTRAVENOUS | Status: DC

## 2016-01-20 MED ORDER — LATANOPROST 0.005 % OP SOLN
1.0000 [drp] | Freq: Every day | OPHTHALMIC | Status: DC
  Administered 2016-01-20: 1 [drp] via OPHTHALMIC
  Filled 2016-01-20: qty 2.5

## 2016-01-20 MED ORDER — PHENYLEPHRINE 40 MCG/ML (10ML) SYRINGE FOR IV PUSH (FOR BLOOD PRESSURE SUPPORT)
PREFILLED_SYRINGE | INTRAVENOUS | Status: AC
  Filled 2016-01-20: qty 10

## 2016-01-20 MED ORDER — OXYCODONE HCL 5 MG PO TABS: 5.0000 mg | ORAL_TABLET | Freq: Once | ORAL | Status: DC | PRN

## 2016-01-20 MED ORDER — ADULT MULTIVITAMIN W/MINERALS CH
1.0000 | ORAL_TABLET | Freq: Every day | ORAL | Status: DC
  Administered 2016-01-21: 1 via ORAL
  Filled 2016-01-20 (×2): qty 1

## 2016-01-20 MED ORDER — CITALOPRAM HYDROBROMIDE 20 MG PO TABS
20.0000 mg | ORAL_TABLET | Freq: Every day | ORAL | Status: DC
  Administered 2016-01-21: 20 mg via ORAL
  Filled 2016-01-20: qty 1

## 2016-01-20 MED ORDER — AMLODIPINE BESYLATE 10 MG PO TABS
10.0000 mg | ORAL_TABLET | Freq: Every day | ORAL | Status: DC
  Administered 2016-01-21: 10 mg via ORAL
  Filled 2016-01-20: qty 1

## 2016-01-20 MED ORDER — SUGAMMADEX SODIUM 200 MG/2ML IV SOLN
INTRAVENOUS | Status: DC | PRN
  Administered 2016-01-20: 200 mg via INTRAVENOUS

## 2016-01-20 MED ORDER — EPHEDRINE 5 MG/ML INJ
INTRAVENOUS | Status: AC
  Filled 2016-01-20: qty 10

## 2016-01-20 MED ORDER — EZETIMIBE-SIMVASTATIN 10-20 MG PO TABS
1.0000 | ORAL_TABLET | Freq: Every day | ORAL | Status: DC
  Administered 2016-01-20: 1 via ORAL
  Filled 2016-01-20: qty 1

## 2016-01-20 MED ORDER — SODIUM CHLORIDE 0.9 % IV SOLN: 250.0000 mL | INTRAVENOUS | Status: DC

## 2016-01-20 MED ORDER — OXYCODONE-ACETAMINOPHEN 5-325 MG PO TABS
1.0000 | ORAL_TABLET | ORAL | Status: DC | PRN
  Administered 2016-01-20 – 2016-01-21 (×4): 2 via ORAL
  Filled 2016-01-20 (×4): qty 2

## 2016-01-20 MED ORDER — LACTATED RINGERS IV SOLN
INTRAVENOUS | Status: DC
  Administered 2016-01-20 (×3): via INTRAVENOUS

## 2016-01-20 MED ORDER — INSULIN ASPART 100 UNIT/ML ~~LOC~~ SOLN
0.0000 [IU] | Freq: Three times a day (TID) | SUBCUTANEOUS | Status: DC
Start: 2016-01-21 — End: 2016-01-21
  Administered 2016-01-21: 4 [IU] via SUBCUTANEOUS

## 2016-01-20 MED ORDER — INSULIN ASPART 100 UNIT/ML ~~LOC~~ SOLN
0.0000 [IU] | Freq: Every day | SUBCUTANEOUS | Status: DC
  Administered 2016-01-20: 3 [IU] via SUBCUTANEOUS

## 2016-01-20 MED ORDER — FENTANYL CITRATE (PF) 100 MCG/2ML IJ SOLN
INTRAMUSCULAR | Status: AC
  Administered 2016-01-20: 50 ug via INTRAVENOUS
  Filled 2016-01-20: qty 2

## 2016-01-20 MED ORDER — SUCCINYLCHOLINE CHLORIDE 200 MG/10ML IV SOSY
PREFILLED_SYRINGE | INTRAVENOUS | Status: AC
  Filled 2016-01-20: qty 10

## 2016-01-20 MED ORDER — CEFAZOLIN SODIUM-DEXTROSE 2-4 GM/100ML-% IV SOLN
2.0000 g | Freq: Three times a day (TID) | INTRAVENOUS | Status: AC
  Administered 2016-01-20 – 2016-01-21 (×2): 2 g via INTRAVENOUS
  Filled 2016-01-20 (×2): qty 100

## 2016-01-20 SURGICAL SUPPLY — 69 items
BAG DECANTER FOR FLEXI CONT (MISCELLANEOUS) ×3 IMPLANT
BASKET BONE COLLECTION (BASKET) ×3 IMPLANT
BENZOIN TINCTURE PRP APPL 2/3 (GAUZE/BANDAGES/DRESSINGS) ×3 IMPLANT
BLADE CLIPPER SURG (BLADE) ×3 IMPLANT
BUR MATCHSTICK NEURO 3.0 LAGG (BURR) ×3 IMPLANT
BUR PRECISION FLUTE 6.0 (BURR) ×3 IMPLANT
CAGE ALTERA 10X31X9-13 15D (Cage) ×2 IMPLANT
CAGE ALTERA 9-13-15-31MM (Cage) ×1 IMPLANT
CANISTER SUCT 3000ML PPV (MISCELLANEOUS) ×3 IMPLANT
CAP REVERE LOCKING (Cap) ×12 IMPLANT
CARTRIDGE OIL MAESTRO DRILL (MISCELLANEOUS) ×1 IMPLANT
CLOSURE WOUND 1/2 X4 (GAUZE/BANDAGES/DRESSINGS) ×1
CONT SPEC 4OZ CLIKSEAL STRL BL (MISCELLANEOUS) ×3 IMPLANT
COVER BACK TABLE 60X90IN (DRAPES) ×3 IMPLANT
COVER TABLE BACK 60X90 (DRAPES) ×3 IMPLANT
DIFFUSER DRILL AIR PNEUMATIC (MISCELLANEOUS) ×3 IMPLANT
DRAPE C-ARM 42X72 X-RAY (DRAPES) ×6 IMPLANT
DRAPE HALF SHEET 40X57 (DRAPES) ×3 IMPLANT
DRAPE LAPAROTOMY 100X72X124 (DRAPES) ×3 IMPLANT
DRAPE POUCH INSTRU U-SHP 10X18 (DRAPES) ×3 IMPLANT
DRAPE SURG 17X23 STRL (DRAPES) ×12 IMPLANT
ELECT BLADE 4.0 EZ CLEAN MEGAD (MISCELLANEOUS) ×3
ELECT REM PT RETURN 9FT ADLT (ELECTROSURGICAL) ×3
ELECTRODE BLDE 4.0 EZ CLN MEGD (MISCELLANEOUS) ×1 IMPLANT
ELECTRODE REM PT RTRN 9FT ADLT (ELECTROSURGICAL) ×1 IMPLANT
EVACUATOR 1/8 PVC DRAIN (DRAIN) IMPLANT
GAUZE SPONGE 4X4 12PLY STRL (GAUZE/BANDAGES/DRESSINGS) ×3 IMPLANT
GAUZE SPONGE 4X4 16PLY XRAY LF (GAUZE/BANDAGES/DRESSINGS) ×3 IMPLANT
GLOVE BIO SURGEON STRL SZ 6.5 (GLOVE) ×2 IMPLANT
GLOVE BIO SURGEON STRL SZ7 (GLOVE) ×3 IMPLANT
GLOVE BIO SURGEON STRL SZ8 (GLOVE) ×9 IMPLANT
GLOVE BIO SURGEON STRL SZ8.5 (GLOVE) ×6 IMPLANT
GLOVE BIO SURGEONS STRL SZ 6.5 (GLOVE) ×1
GLOVE BIOGEL PI IND STRL 7.0 (GLOVE) ×3 IMPLANT
GLOVE BIOGEL PI IND STRL 7.5 (GLOVE) ×3 IMPLANT
GLOVE BIOGEL PI INDICATOR 7.0 (GLOVE) ×6
GLOVE BIOGEL PI INDICATOR 7.5 (GLOVE) ×6
GLOVE INDICATOR 8.5 STRL (GLOVE) ×3 IMPLANT
GOWN STRL REUS W/ TWL LRG LVL3 (GOWN DISPOSABLE) ×1 IMPLANT
GOWN STRL REUS W/ TWL XL LVL3 (GOWN DISPOSABLE) ×4 IMPLANT
GOWN STRL REUS W/TWL 2XL LVL3 (GOWN DISPOSABLE) IMPLANT
GOWN STRL REUS W/TWL LRG LVL3 (GOWN DISPOSABLE) ×2
GOWN STRL REUS W/TWL XL LVL3 (GOWN DISPOSABLE) ×8
KIT BASIN OR (CUSTOM PROCEDURE TRAY) ×3 IMPLANT
KIT ROOM TURNOVER OR (KITS) ×3 IMPLANT
NEEDLE HYPO 21X1.5 SAFETY (NEEDLE) ×3 IMPLANT
NEEDLE HYPO 22GX1.5 SAFETY (NEEDLE) ×3 IMPLANT
NS IRRIG 1000ML POUR BTL (IV SOLUTION) ×3 IMPLANT
OIL CARTRIDGE MAESTRO DRILL (MISCELLANEOUS) ×3
PACK LAMINECTOMY NEURO (CUSTOM PROCEDURE TRAY) ×3 IMPLANT
PAD ARMBOARD 7.5X6 YLW CONV (MISCELLANEOUS) ×9 IMPLANT
PATTIES SURGICAL .5 X1 (DISPOSABLE) IMPLANT
ROD REVERE 6.35 40MM (Rod) ×6 IMPLANT
SCREW 7.5X50MM (Screw) ×6 IMPLANT
SCREW REVERE 6.35 75X55MM (Screw) ×6 IMPLANT
SPONGE GAUZE 4X4 12PLY STER LF (GAUZE/BANDAGES/DRESSINGS) ×3 IMPLANT
SPONGE LAP 4X18 X RAY DECT (DISPOSABLE) IMPLANT
SPONGE NEURO XRAY DETECT 1X3 (DISPOSABLE) IMPLANT
SPONGE SURGIFOAM ABS GEL 100 (HEMOSTASIS) ×3 IMPLANT
STRIP BIOACTIVE 20CC 25X100X8 (Miscellaneous) ×3 IMPLANT
STRIP CLOSURE SKIN 1/2X4 (GAUZE/BANDAGES/DRESSINGS) ×2 IMPLANT
SUT VIC AB 1 CT1 18XBRD ANBCTR (SUTURE) ×2 IMPLANT
SUT VIC AB 1 CT1 8-18 (SUTURE) ×4
SUT VIC AB 2-0 CP2 18 (SUTURE) ×6 IMPLANT
TAPE CLOTH SURG 4X10 WHT LF (GAUZE/BANDAGES/DRESSINGS) ×3 IMPLANT
TOWEL OR 17X24 6PK STRL BLUE (TOWEL DISPOSABLE) ×3 IMPLANT
TOWEL OR 17X26 10 PK STRL BLUE (TOWEL DISPOSABLE) ×3 IMPLANT
TRAY FOLEY W/METER SILVER 16FR (SET/KITS/TRAYS/PACK) ×3 IMPLANT
WATER STERILE IRR 1000ML POUR (IV SOLUTION) ×3 IMPLANT

## 2016-01-20 NOTE — Anesthesia Procedure Notes (Signed)
Procedure Name: Intubation Date/Time: 01/20/2016 1:00 PM Performed by: Merdis Delay Pre-anesthesia Checklist: Patient identified, Emergency Drugs available, Suction available, Timeout performed and Patient being monitored Patient Re-evaluated:Patient Re-evaluated prior to inductionOxygen Delivery Method: Circle system utilized Preoxygenation: Pre-oxygenation with 100% oxygen Intubation Type: IV induction Ventilation: Oral airway inserted - appropriate to patient size, Two handed mask ventilation required and Mask ventilation without difficulty Laryngoscope Size: Glidescope and 4 Grade View: Grade II Tube type: Oral Tube size: 7.5 mm Number of attempts: 1 Airway Equipment and Method: Stylet Placement Confirmation: ETT inserted through vocal cords under direct vision,  positive ETCO2,  CO2 detector and breath sounds checked- equal and bilateral Secured at: 22 cm Tube secured with: Tape Dental Injury: Teeth and Oropharynx as per pre-operative assessment  Difficulty Due To: Difficulty was anticipated Comments: DLx1 with MAC4- epiglottis view only. Glidescope 4--> grade 2 view. Large/short neck, OSA.

## 2016-01-20 NOTE — Progress Notes (Signed)
Inpatient Diabetes Program Recommendations  AACE/ADA: New Consensus Statement on Inpatient Glycemic Control (2015)  Target Ranges:  Prepandial:   less than 140 mg/dL      Peak postprandial:   less than 180 mg/dL (1-2 hours)      Critically ill patients:  140 - 180 mg/dL   Lab Results  Component Value Date   GLUCAP 254 (H) 01/20/2016   HGBA1C 8.3 (H) 01/07/2016    Diabetes history: Type 2 diabetes Outpatient Diabetes medications: Amaryl 4 mg daily, Lantus 75 units q HS, Januvia 100 mg daily  Inpatient Diabetes Program Recommendations:   Note patient instructed to take Lantus 37 units last PM. CBG=254 mg/dL this morning and pt. Received Novolog 8 units this morning.   If patient is admitted, please consider ordering Lantus 50 units q HS this PM per Glycemic control orderset.  Also consider adding Novolog moderate correction with Novolog meal coverage 5 units tid with meals.   Thanks, Adah Perl, RN, BC-ADM Inpatient Diabetes Coordinator Pager [phone] (8a-5p)

## 2016-01-20 NOTE — Progress Notes (Signed)
Spoke with Dr. Linna Caprice, relative to pt.'s CBG, he will be visiting pt. Soon.

## 2016-01-20 NOTE — Anesthesia Preprocedure Evaluation (Signed)
Anesthesia Evaluation  Patient identified by MRN, date of birth, ID band Patient awake    Reviewed: Allergy & Precautions, NPO status , Patient's Chart, lab work & pertinent test results  Airway Mallampati: II  TM Distance: >3 FB Neck ROM: Full    Dental  (+) Teeth Intact, Dental Advisory Given   Pulmonary former smoker,    breath sounds clear to auscultation       Cardiovascular hypertension,  Rhythm:Regular Rate:Normal     Neuro/Psych    GI/Hepatic   Endo/Other  diabetes  Renal/GU      Musculoskeletal   Abdominal   Peds  Hematology   Anesthesia Other Findings   Reproductive/Obstetrics                             Anesthesia Physical Anesthesia Plan  ASA: III  Anesthesia Plan: General   Post-op Pain Management:    Induction: Intravenous  Airway Management Planned:   Additional Equipment:   Intra-op Plan:   Post-operative Plan: Extubation in OR  Informed Consent: I have reviewed the patients History and Physical, chart, labs and discussed the procedure including the risks, benefits and alternatives for the proposed anesthesia with the patient or authorized representative who has indicated his/her understanding and acceptance.   Dental advisory given  Plan Discussed with: CRNA and Anesthesiologist  Anesthesia Plan Comments:         Anesthesia Quick Evaluation

## 2016-01-20 NOTE — Op Note (Signed)
Brief history: The patient is a 71 year old white male who has complained of back, buttock, and leg pain consistent with neurogenic claudication. He has failed medical management and was worked up with a lumbar MRI. This demonstrated an L4-5 spondylolisthesis and spinal stenosis. I discussed the various treatment options with the patient including surgery. The patient has weighed the risks, benefits, and alternatives to surgery and decided proceed with an L4-5 decompression, instrumentation, and fusion.  Preoperative diagnosis: L4-5 spondylolisthesis, spinal stenosis compressing both the L4 and the L5 nerve roots; lumbago; lumbar radiculopathy  Postoperative diagnosis: The same   Procedure: Bilateral L4-5 Laminotomy/foraminotomies to decompress the bilateral L4 and L5 nerve roots(the work required to do this was in addition to the work required to do the posterior lumbar interbody fusion because of the patient's spinal stenosis, facet arthropathy. Etc. requiring a wide decompression of the nerve roots.); L4-5 transforaminal lumbar interbody fusion with local morselized autograft bone and Kinnex graft extender; insertion of interbody prosthesis at L4-5 (globus peek expandable interbody prosthesis); posterior nonsegmental instrumentation from L4 to L5 with globus titanium pedicle screws and rods; posterior lateral arthrodesis at L4-5 with local morselized autograft bone and Kinnex bone graft extender.  Surgeon: Dr. Earle Gell  Asst.: Dr. Saintclair Halsted  Anesthesia: Gen. endotracheal  Estimated blood loss: 250 mL  Drains: None  Complications: None  Description of procedure: The patient was brought to the operating room by the anesthesia team. General endotracheal anesthesia was induced. The patient was turned to the prone position on the Wilson frame. The patient's lumbosacral region was then prepared with Betadine scrub and Betadine solution. Sterile drapes were applied.  I then injected the area to be  incised with Marcaine with epinephrine solution. I then used the scalpel to make a linear midline incision over the L4-5 interspace. I then used electrocautery to perform a bilateral subperiosteal dissection exposing the spinous process and lamina of L4 and L5. We then obtained intraoperative radiograph to confirm our location. We then inserted the Verstrac retractor to provide exposure.  I began the decompression by using the high speed drill to perform laminotomies at L4-5 bilaterally. We then used the Kerrison punches to widen the laminotomy and removed the ligamentum flavum at L4-5 bilaterally. We used the Kerrison punches to remove the medial facets at L4-5 bilaterally. We performed wide foraminotomies about the bilateral L4 and L5 nerve roots completing the decompression.  We now turned our attention to the posterior lumbar interbody fusion. I used a scalpel to incise the intervertebral disc at L4-5 bilaterally. I then performed a partial intervertebral discectomy at L4-5 bilaterally using the pituitary forceps. We prepared the vertebral endplates at 075-GRM bilaterally for the fusion by removing the soft tissues with the curettes. We then used the trial spacers to pick the appropriate sized interbody prosthesis. We prefilled his prosthesis with a combination of local morselized autograft bone that we obtained during the decompression as well as Kinnex bone graft extender. We inserted the prefilled prosthesis into the interspace at L4-5, we then expanded the prosthesis. There was a good snug fit of the prosthesis in the interspace. We then filled and the remainder of the intervertebral disc space with local morselized autograft bone and Kinnex. This completed the posterior lumbar interbody arthrodesis.  We now turned attention to the instrumentation. Under fluoroscopic guidance we cannulated the bilateral L4 and L5 pedicles with the bone probe. We then removed the bone probe. We then tapped the pedicle with  a 6.5 millimeter tap. We then removed  the tap. We probed inside the tapped pedicle with a ball probe to rule out cortical breaches. We then inserted a 7.5 x 50 and 55 millimeter pedicle screw into the L4 and L5 pedicles bilaterally under fluoroscopic guidance. We then palpated along the medial aspect of the pedicles to rule out cortical breaches. There were none. The nerve roots were not injured. We then connected the unilateral pedicle screws with a lordotic rod. We compressed the construct and secured the rod in place with the caps. We then tightened the caps appropriately. This completed the instrumentation from L4-5.  We now turned our attention to the posterior lateral arthrodesis at L4-5 bilaterally. We used the high-speed drill to decorticate the remainder of the facets, pars, transverse process at L4-5 bilaterally. We then applied a combination of local morselized autograft bone and Kinnex bone graft extender over these decorticated posterior lateral structures. This completed the posterior lateral arthrodesis.  We then obtained hemostasis using bipolar electrocautery. We irrigated the wound out with bacitracin solution. We inspected the thecal sac and nerve roots and noted they were well decompressed. We then removed the retractor. We placed vancomycin powder in the wound. We reapproximated patient's thoracolumbar fascia with interrupted #1 Vicryl suture. We reapproximated patient's subcutaneous tissue with interrupted 2-0 Vicryl suture. The reapproximated patient's skin with Steri-Strips and benzoin. The wound was then coated with bacitracin ointment. A sterile dressing was applied. The drapes were removed. The patient was subsequently returned to the supine position where they were extubated by the anesthesia team. He was then transported to the post anesthesia care unit in stable condition. All sponge instrument and needle counts were reportedly correct at the end of this case.

## 2016-01-20 NOTE — H&P (Signed)
Subjective: The patient is a 71 year old white male who has complained of back, buttock, and leg pain consistent with neurogenic claudication. He has failed medical management and was worked up with a lumbar MRI. This demonstrated an L4-5 spondylolisthesis with spinal stenosis. I discussed the various treatment options with the patient. He has decided to proceed with a L4-5 decompression, instrumentation, and fusion after weighing the risks, benefits, and alternatives to surgery.   Past Medical History:  Diagnosis Date  . Allergic rhinitis   . DDD (degenerative disc disease), cervical   . Depressive disorder   . Diabetes mellitus without complication ([place])   . Diabetic peripheral neuropathy ([place])   . Glaucoma   . History of chickenpox   . HNP (herniated nucleus pulposus with myelopathy), thoracic   . Hyperlipidemia   . Hypertension   . Hypertrophy of prostate without urinary obstruction   . Lumbar radiculitis   . Lumbar stenosis   . Obesity   . OSA on CPAP   . Shingles     Past Surgical History:  Procedure Laterality Date  . CERVICAL FUSION  2012  . COLONOSCOPY WITH PROPOFOL N/A 07/01/2015   Procedure: COLONOSCOPY WITH PROPOFOL;  Surgeon: Manya Silvas, MD;  Location: [facility] ENDOSCOPY;  Service: Endoscopy;  Laterality: N/A;  . POSTERIOR CERVICAL FUSION/FORAMINOTOMY  2012  . TONSILLECTOMY      Allergies  Allergen Reactions  . Pilocarpine Hcl Other (See Comments)    UNSPECIFIED MECHANISM Corneal lesions    Social History  Substance Use Topics  . Smoking status: Former Smoker    Quit date: 01/07/1984  . Smokeless tobacco: Never Used  . Alcohol use 1.2 oz/week    2 Glasses of wine per week     Comment: daily    History reviewed. No pertinent family history. Prior to Admission medications   Medication Sig Start Date End Date Taking? Authorizing Provider  amLODipine-benazepril (LOTREL) 10-20 MG capsule Take 1 capsule by mouth daily.   Yes Historical Provider, MD  aspirin 81  MG tablet Take 81 mg by mouth daily. Will stop prior to procedure   Yes Historical Provider, MD  cetirizine (ZYRTEC) 10 MG tablet Take 10 mg by mouth at bedtime.   Yes Historical Provider, MD  citalopram (CELEXA) 20 MG tablet Take 20 mg by mouth daily.   Yes Historical Provider, MD  ezetimibe-simvastatin (VYTORIN) 10-20 MG tablet Take 1 tablet by mouth daily.    Yes Historical Provider, MD  fluticasone (FLONASE) 50 MCG/ACT nasal spray Place 2 sprays into both nostrils every evening.    Yes Historical Provider, MD  gabapentin (NEURONTIN) 300 MG capsule 2 capsules daily 01/15/15  Yes Historical Provider, MD  glimepiride (AMARYL) 4 MG tablet Take 4 mg by mouth daily with breakfast.   Yes Historical Provider, MD  hydrochlorothiazide (HYDRODIURIL) 25 MG tablet Take 25 mg by mouth daily.   Yes Historical Provider, MD  ibuprofen (ADVIL,MOTRIN) 200 MG tablet Take 600 mg by mouth every 6 (six) hours as needed for moderate pain.   Yes Historical Provider, MD  insulin glargine (LANTUS) 100 UNIT/ML injection Inject 75 Units into the skin at bedtime.   Yes Historical Provider, MD  latanoprost (XALATAN) 0.005 % ophthalmic solution Place 1 drop into both eyes at bedtime.    Yes Historical Provider, MD  metformin (FORTAMET) 1000 MG (OSM) 24 hr tablet Take 1,000 mg by mouth 2 (two) times daily with a meal.   Yes Historical Provider, MD  Multiple Vitamin (MULTIVITAMIN) tablet Take 1 tablet  by mouth daily.   Yes Historical Provider, MD  sitaGLIPtin (JANUVIA) 100 MG tablet Take 100 mg by mouth daily.   Yes Historical Provider, MD  tamsulosin (FLOMAX) 0.4 MG CAPS capsule Take 0.4 mg by mouth every other day.    Yes Historical Provider, MD  timolol (TIMOPTIC) 0.5 % ophthalmic solution Place 1 drop into both eyes every evening.    Yes Historical Provider, MD  triamcinolone cream (KENALOG) 0.1 % Apply 1 application topically as needed (rash).    Yes Historical Provider, MD     Review of Systems  Positive ROS: As  above  All other systems have been reviewed and were otherwise negative with the exception of those mentioned in the HPI and as above.  Objective: Vital signs in last 24 hours: Temp:  [98.2 F (36.8 C)] 98.2 F (36.8 C) (01/08 0924) Pulse Rate:  [72] 72 (01/08 0924) Resp:  [20] 20 (01/08 0924) BP: (164)/(74) 164/74 (01/08 0924) SpO2:  [98 %] 98 % (01/08 0924)  General Appearance: Alert Head: Normocephalic, without obvious abnormality, atraumatic Eyes: PERRL, conjunctiva/corneas clear, EOM's intact,    Ears: Normal  Throat: Normal  Neck: Supple, his cervical incisions are well-healed. Back: unremarkable Lungs: Clear to auscultation bilaterally, respirations unlabored Heart: Regular rate and rhythm, no murmur, rub or gallop Abdomen: Soft, non-tender Extremities: Extremities normal, atraumatic, no cyanosis or edema Skin: unremarkable  NEUROLOGIC:   Mental status: alert and oriented,Motor Exam - grossly normal Sensory Exam - grossly normal Reflexes:  Coordination - grossly normal Gait - grossly normal Balance - grossly normal Cranial Nerves: I: smell Not tested  II: visual acuity  OS: Normal  OD: Normal   II: visual fields Full to confrontation  II: pupils Equal, round, reactive to light  III,VII: ptosis None  III,IV,VI: extraocular muscles  Full ROM  V: mastication Normal  V: facial light touch sensation  Normal  V,VII: corneal reflex  Present  VII: facial muscle function - upper  Normal  VII: facial muscle function - lower Normal  VIII: hearing Not tested  IX: soft palate elevation  Normal  IX,X: gag reflex Present  XI: trapezius strength  5/5  XI: sternocleidomastoid strength 5/5  XI: neck flexion strength  5/5  XII: tongue strength  Normal    Data Review Lab Results  Component Value Date   WBC 5.2 01/07/2016   HGB 15.8 01/07/2016   HCT 44.4 01/07/2016   MCV 91.9 01/07/2016   PLT 133 (L) 01/07/2016   Lab Results  Component Value Date   NA 136  01/07/2016   K 4.0 01/07/2016   CL 100 (L) 01/07/2016   CO2 27 01/07/2016   BUN 11 01/07/2016   CREATININE 0.66 01/07/2016   GLUCOSE 263 (H) 01/07/2016   No results found for: INR, PROTIME  Assessment/Plan: L4-5 spondylolisthesis, spinal stenosis, lumbago, lumbar radiculopathy, neurogenic claudication: I have discussed the situation with the patient. I have reviewed his imaging studies with him and pointed out the abnormalities. We have discussed the various treatment options including surgery. I have described the surgical treatment option of an L4-5 decompression, instrumentation, and fusion. I have shown him surgical models. We have discussed the risks, benefits, alternatives, expected postoperative course, and likelihood of achieving his goals with surgery. I have answered all his questions. He has decided to proceed with surgery.   Shelby Anderle D 01/20/2016 12:18 PM

## 2016-01-20 NOTE — Transfer of Care (Signed)
Immediate Anesthesia Transfer of Care Note  Patient: Miguel White  Procedure(s) Performed: Procedure(s) with comments: DECOMPRESSION LAMINECTOMY WITH PEDICLE SCREWS AND RODS, PLACEMENT OF INTERBODY PROSTHESIS POSTERIOR AND  LATERAL ARTHRODESIS LUMBAR FOUR-FIVE (N/A) - DECOMPRESSION LAMINECTOMY WITH PEDICLE SCREWS AND RODS, PLACEMENT OF INTERBODY PROSTHESIS POSTERIOR AND  LATERAL ARTHRODESIS L4-L5  Patient Location: PACU  Anesthesia Type:General  Level of Consciousness: awake, alert  and oriented  Airway & Oxygen Therapy: Patient Spontanous Breathing and Patient connected to face mask oxygen  Post-op Assessment: Report given to RN and Post -op Vital signs reviewed and stable  Post vital signs: Reviewed and stable  Last Vitals:  Vitals:   01/20/16 0924 01/20/16 1711  BP: (!) 164/74   Pulse: 72 (P) 95  Resp: 20 (P) 16  Temp: 36.8 C     Last Pain:  Vitals:   01/20/16 0924  TempSrc: Oral         Complications: No apparent anesthesia complications

## 2016-01-20 NOTE — Anesthesia Postprocedure Evaluation (Addendum)
Anesthesia Post Note  Patient: KEYION EISCHEID  Procedure(s) Performed: Procedure(s) (LRB): DECOMPRESSION LAMINECTOMY WITH PEDICLE SCREWS AND RODS, PLACEMENT OF INTERBODY PROSTHESIS POSTERIOR AND  LATERAL ARTHRODESIS LUMBAR FOUR-FIVE (N/A)  Patient location during evaluation: PACU Anesthesia Type: General Level of consciousness: awake, awake and alert and oriented Pain management: pain level controlled Vital Signs Assessment: post-procedure vital signs reviewed and stable Respiratory status: spontaneous breathing, nonlabored ventilation and respiratory function stable Cardiovascular status: blood pressure returned to baseline Anesthetic complications: no       Last Vitals:  Vitals:   01/20/16 1715 01/20/16 1730  BP: (!) 145/97 (!) 144/66  Pulse: 97 91  Resp: 12 10  Temp:      Last Pain:  Vitals:   01/20/16 1711  TempSrc:   PainSc: 5                  Sintia Mckissic COKER

## 2016-01-20 NOTE — Progress Notes (Signed)
Subjective:  The patient is alert and pleasant. He looks well.  Objective: Vital signs in last 24 hours: Temp:  [98.1 F (36.7 C)-98.2 F (36.8 C)] 98.1 F (36.7 C) (01/08 1711) Pulse Rate:  [72-97] 91 (01/08 1730) Resp:  [10-20] 10 (01/08 1730) BP: (144-164)/(66-97) 144/66 (01/08 1730) SpO2:  [98 %-100 %] 100 % (01/08 1730)  Intake/Output from previous day: No intake/output data recorded. Intake/Output this shift: Total I/O In: 2400 [I.V.:2400] Out: 785 [Urine:460; Blood:325]  Physical exam the patient is alert and pleasant. His strength is normal in his lower extremities.  Lab Results: No results for input(s): WBC, HGB, HCT, PLT in the last 72 hours. BMET No results for input(s): NA, K, CL, CO2, GLUCOSE, BUN, CREATININE, CALCIUM in the last 72 hours.  Studies/Results: Dg Lumbar Spine 2-3 Views  Result Date: 01/20/2016 CLINICAL DATA:  Decompression laminectomy with pedicle screws and rods EXAM: DG C-ARM 61-120 MIN; LUMBAR SPINE - 2-3 VIEW COMPARISON:  12/04/2015 FINDINGS: Total fluoroscopy time was 32 seconds. Three low resolution intraoperative spot films of the lumbar spine are submitted for interpretation. Images demonstrate localization of the L4-L5 disc space. Placement of transpedicular screws at L4 and L5. IMPRESSION: Intraoperative fluoroscopic assistance provided during decompression laminectomy with pedicle screw fixation Electronically Signed   By: Donavan Foil M.D.   On: 01/20/2016 17:15   Dg C-arm 61-120 Min  Result Date: 01/20/2016 CLINICAL DATA:  Decompression laminectomy with pedicle screws and rods EXAM: DG C-ARM 61-120 MIN; LUMBAR SPINE - 2-3 VIEW COMPARISON:  12/04/2015 FINDINGS: Total fluoroscopy time was 32 seconds. Three low resolution intraoperative spot films of the lumbar spine are submitted for interpretation. Images demonstrate localization of the L4-L5 disc space. Placement of transpedicular screws at L4 and L5. IMPRESSION: Intraoperative fluoroscopic  assistance provided during decompression laminectomy with pedicle screw fixation Electronically Signed   By: Donavan Foil M.D.   On: 01/20/2016 17:15    Assessment/Plan: The patient is doing well.  LOS: 0 days     Cira Deyoe D 01/20/2016, 5:51 PM

## 2016-01-20 NOTE — Progress Notes (Signed)
Order rec'd From Dr. Linna Caprice, regarding treatment for CBBG.

## 2016-01-21 LAB — BASIC METABOLIC PANEL
Anion gap: 10 (ref 5–15)
BUN: 15 mg/dL (ref 6–20)
CHLORIDE: 101 mmol/L (ref 101–111)
CO2: 24 mmol/L (ref 22–32)
Calcium: 8.9 mg/dL (ref 8.9–10.3)
Creatinine, Ser: 0.66 mg/dL (ref 0.61–1.24)
GFR calc Af Amer: >60 mL/min (ref >60)
GFR calc non Af Amer: >60 mL/min (ref >60)
GLUCOSE: 203 mg/dL — AB (ref 65–99)
POTASSIUM: 4 mmol/L (ref 3.5–5.1)
Sodium: 135 mmol/L (ref 135–145)

## 2016-01-21 LAB — HEMOGLOBIN A1C
HEMOGLOBIN A1C: 8 % — AB (ref 4.8–5.6)
MEAN PLASMA GLUCOSE: 183 mg/dL

## 2016-01-21 LAB — CBC
HCT: 37.3 % — ABNORMAL LOW (ref 39.0–52.0)
HEMOGLOBIN: 13.3 g/dL (ref 13.0–17.0)
MCH: 32.6 pg (ref 26.0–34.0)
MCHC: 35.7 g/dL (ref 30.0–36.0)
MCV: 91.4 fL (ref 78.0–100.0)
PLATELETS: 120 10*3/uL — AB (ref 150–400)
RBC: 4.08 MIL/uL — AB (ref 4.22–5.81)
RDW: 12.9 % (ref 11.5–15.5)
WBC: 8.4 10*3/uL (ref 4.0–10.5)

## 2016-01-21 LAB — GLUCOSE, CAPILLARY: Glucose-Capillary: 189 mg/dL — ABNORMAL HIGH (ref 65–99)

## 2016-01-21 MED ORDER — CYCLOBENZAPRINE HCL 10 MG PO TABS: 10.0000 mg | ORAL_TABLET | Freq: Three times a day (TID) | ORAL | 1 refills | Status: DC | PRN

## 2016-01-21 MED ORDER — OXYCODONE-ACETAMINOPHEN 5-325 MG PO TABS: 1.0000 | ORAL_TABLET | ORAL | 0 refills | Status: DC | PRN

## 2016-01-21 MED ORDER — DOCUSATE SODIUM 100 MG PO CAPS: 100.0000 mg | ORAL_CAPSULE | Freq: Two times a day (BID) | ORAL | 0 refills | Status: DC

## 2016-01-21 NOTE — Evaluation (Signed)
Physical Therapy Evaluation and Discharge Patient Details Name: Miguel White MRN: [id number] DOB: Jul 17, 1945 Today's Date: 01/21/2016   History of Present Illness  The patient is a 71 year old white male who has complained of back, buttock, and leg pain consistent with neurogenic claudication. Pt s/p bilat L4-5 laminotomy/foraminotomies for decompression.  Clinical Impression  Patient is s/p above surgery. Pt with good understanding of back precautions and functioning at supervision level. Pt with good home support and can safely navigate stairs. Pt with no further acute PT needs at this time. PT SIGNING OFF. Please re-consult if needed in the future.    Follow Up Recommendations No PT follow up;Supervision - Intermittent    Equipment Recommendations  Rolling walker with 5" wheels    Recommendations for Other Services       Precautions / Restrictions Precautions Precautions: Back Precaution Booklet Issued: Yes (comment) Precaution Comments: pt with verbal understanding Required Braces or Orthoses: Spinal Brace Spinal Brace: Lumbar corset;Applied in sitting position Restrictions Weight Bearing Restrictions: No      Mobility  Bed Mobility               General bed mobility comments: pt up in hallway walking, demo'd proper technique, pt with verbal understanding and report of rolling out of bed with RN staff  Transfers Overall transfer level: Needs assistance Equipment used: Rolling walker (2 wheeled) Transfers: Sit to/from Stand Sit to Stand: Modified independent (Device/Increase time)         General transfer comment: v/c's to push up from surface not reaching for RW  Ambulation/Gait Ambulation/Gait assistance: Supervision Ambulation Distance (Feet): 200 Feet Assistive device: Rolling walker (2 wheeled) Gait Pattern/deviations: Step-through pattern Gait velocity: slow Gait velocity interpretation: Below normal speed for age/gender General Gait Details:  v/c's to contract abdomen muscles, relax shoulders, stay in walker to minimize trunk flexion  Stairs Stairs: Yes Stairs assistance: Min guard Stair Management: One rail Right;Sideways Number of Stairs: 12 General stair comments: attempted forwards but increased strain on back, pt with improved safety and ease with sideways technique  Wheelchair Mobility    Modified Rankin (Stroke Patients Only)       Balance Overall balance assessment: No apparent balance deficits (not formally assessed)                                           Pertinent Vitals/Pain Pain Assessment: 0-10 Pain Score: 5  Pain Location: back, operational site Pain Descriptors / Indicators: Sore Pain Intervention(s): Monitored during session;Premedicated before session    Home Living Family/patient expects to be discharged to:: Private residence Living Arrangements: Spouse/significant other Available Help at Discharge: Family;Available PRN/intermittently Type of Home: House Home Access: Stairs to enter Entrance Stairs-Rails: None Entrance Stairs-Number of Steps: 2 Home Layout: Two level Home Equipment: None      Prior Function Level of Independence: Independent         Comments: retired     Journalist, newspaper   Dominant Hand: Right    Extremity/Trunk Assessment   Upper Extremity Assessment Upper Extremity Assessment: Overall WFL for tasks assessed    Lower Extremity Assessment Lower Extremity Assessment: Overall WFL for tasks assessed    Cervical / Trunk Assessment Cervical / Trunk Assessment: Other exceptions Cervical / Trunk Exceptions: back surgery  Communication   Communication: No difficulties  Cognition Arousal/Alertness: Awake/alert Behavior During Therapy: WFL for tasks assessed/performed Overall Cognitive Status:  Within Functional Limits for tasks assessed                      General Comments General comments (skin integrity, edema, etc.): incision  intact    Exercises     Assessment/Plan    PT Assessment Patent does not need any further PT services  PT Problem List            PT Treatment Interventions      PT Goals (Current goals can be found in the Care Plan section)  Acute Rehab PT Goals Patient Stated Goal: home this afternnon PT Goal Formulation: All assessment and education complete, DC therapy    Frequency     Barriers to discharge        Co-evaluation               End of Session Equipment Utilized During Treatment: Back brace Activity Tolerance: Patient tolerated treatment well Patient left: in chair;with call bell/phone within reach Nurse Communication: Mobility status         Time: [id number] PT Time Calculation (min) (ACUTE ONLY): 26 min   Charges:   PT Evaluation $PT Eval Moderate Complexity: 1 Procedure PT Treatments $Gait Training: 8-22 mins   PT G Codes:        Miguel White 01/21/2016, 7:55 AM   Miguel White, PT, DPT Pager #: [phone] Office #: [phone]

## 2016-01-21 NOTE — Progress Notes (Signed)
Orthopedic Tech Progress Note Patient Details:  Miguel White [birth year] [id number]  Patient ID: Miguel White, male   DOB: Dec 16, 1945, 71 y.o.   MRN: [id number]   [name] 01/21/2016, 11:23 AMCalled [employer] for Lumbar brace.

## 2016-01-21 NOTE — Discharge Summary (Signed)
Physician Discharge Summary  Patient ID: Miguel White MRN: [id number] DOB/AGE: [birth year] 71 y.o.  Admit date: 01/20/2016 Discharge date: 01/21/2016  Admission Diagnoses:L4-5 spondylolisthesis, spinal stenosis, lumbago, lumbar radiculopathy, neurogenic claudication  Discharge Diagnoses: The same Active Problems:   Spondylolisthesis of lumbar region   Discharged Condition: good  Hospital Course: I performed an L4-5 decompression, instrumentation, and fusion on the patient on 01/20/2016. The surgery went well.  The patient's postoperative course was unremarkable. On postoperative day #1 patient requests discharge to home. He was given written and oral discharge instructions. All his questions were answered.  Consults: Physical therapy Significant Diagnostic Studies: None Treatments: L4-5 decompression, instrumentation, and fusion. Discharge Exam: Blood pressure (!) 114/54, pulse 80, temperature 98.4 F (36.9 C), temperature source Oral, resp. rate 18, SpO2 96 %. The patient is alert and pleasant. He looks well. His strength is grossly normal lower extremities. He is ambulating well.  Disposition: Home   Allergies as of 01/21/2016      Reactions   Pilocarpine Hcl Other (See Comments)   UNSPECIFIED MECHANISM Corneal lesions      Medication List    STOP taking these medications   ibuprofen 200 MG tablet Commonly known as:  ADVIL,MOTRIN     TAKE these medications   amLODipine-benazepril 10-20 MG capsule Commonly known as:  LOTREL Take 1 capsule by mouth daily.   aspirin 81 MG tablet Take 81 mg by mouth daily. Will stop prior to procedure   cetirizine 10 MG tablet Commonly known as:  ZYRTEC Take 10 mg by mouth at bedtime.   citalopram 20 MG tablet Commonly known as:  CELEXA Take 20 mg by mouth daily.   cyclobenzaprine 10 MG tablet Commonly known as:  FLEXERIL Take 1 tablet (10 mg total) by mouth 3 (three) times daily as needed for muscle spasms.   docusate  sodium 100 MG capsule Commonly known as:  COLACE Take 1 capsule (100 mg total) by mouth 2 (two) times daily.   ezetimibe-simvastatin 10-20 MG tablet Commonly known as:  VYTORIN Take 1 tablet by mouth daily.   fluticasone 50 MCG/ACT nasal spray Commonly known as:  FLONASE Place 2 sprays into both nostrils every evening.   gabapentin 300 MG capsule Commonly known as:  NEURONTIN 2 capsules daily   glimepiride 4 MG tablet Commonly known as:  AMARYL Take 4 mg by mouth daily with breakfast.   hydrochlorothiazide 25 MG tablet Commonly known as:  HYDRODIURIL Take 25 mg by mouth daily.   insulin glargine 100 UNIT/ML injection Commonly known as:  LANTUS Inject 75 Units into the skin at bedtime.   latanoprost 0.005 % ophthalmic solution Commonly known as:  XALATAN Place 1 drop into both eyes at bedtime.   metformin 1000 MG (OSM) 24 hr tablet Commonly known as:  FORTAMET Take 1,000 mg by mouth 2 (two) times daily with a meal.   multivitamin tablet Take 1 tablet by mouth daily.   oxyCODONE-acetaminophen 5-325 MG tablet Commonly known as:  PERCOCET/ROXICET Take 1-2 tablets by mouth every 4 (four) hours as needed for moderate pain.   sitaGLIPtin 100 MG tablet Commonly known as:  JANUVIA Take 100 mg by mouth daily.   tamsulosin 0.4 MG Caps capsule Commonly known as:  FLOMAX Take 0.4 mg by mouth every other day.   timolol 0.5 % ophthalmic solution Commonly known as:  TIMOPTIC Place 1 drop into both eyes every evening.   triamcinolone cream 0.1 % Commonly known as:  KENALOG Apply 1 application topically as needed (  rash).        Signed: Ophelia Charter 01/21/2016, 7:34 AM

## 2016-01-21 NOTE — Progress Notes (Signed)
Patient alert and oriented, mae's well, voiding adequate amount of urine, swallowing without difficulty, c/o mild pain and meds given prior to discharged for ride and discomfort. Patient discharged home with family. Script and discharged instructions given to patient. Patient and family stated understanding of instructions given. 

## 2016-01-21 NOTE — Discharge Instructions (Signed)
Wound Care Leave incision covered for 3 days You may shower. Do not scrub directly on incision.  Leave steri-strips on incision.  They will fall off by themselves. Do not put any creams, lotions, or ointments on incision. Activity Walk each and every day, increasing distance each day. No lifting greater than 5 lbs.  Avoid bending, arching, and twisting. No driving for 2 weeks; may ride as a passenger locally. If provided with back brace, wear when out of bed.  It is not necessary to wear in bed. Diet Resume your normal diet.  Return to Work Will be discussed at you follow up appointment. Call Your Doctor If Any of These Occur Redness, drainage, or swelling at the wound.  Temperature greater than 101 degrees. Severe pain not relieved by pain medication. Incision starts to come apart. Follow Up Appt Call today for appointment in 3 weeks [phone]) or for problems.  If you have any hardware placed in your spine, you will need an x-ray before your appointment.

## 2016-01-22 MED FILL — Sodium Chloride IV Soln 0.9%: INTRAVENOUS | Qty: 1000 | Status: AC

## 2016-01-22 MED FILL — Heparin Sodium (Porcine) Inj 1000 Unit/ML: INTRAMUSCULAR | Qty: 30 | Status: AC

## 2016-01-23 ENCOUNTER — Emergency Department (HOSPITAL_COMMUNITY): Payer: Medicare Other

## 2016-01-23 ENCOUNTER — Observation Stay (HOSPITAL_COMMUNITY)
Admission: EM | Admit: 2016-01-23 | Discharge: 2016-01-24 | Disposition: A | Discharge status: Home / Self Care | Payer: Medicare Other | Attending: Neurosurgery | Admitting: Neurosurgery

## 2016-01-23 ENCOUNTER — Encounter (HOSPITAL_COMMUNITY): Payer: Self-pay

## 2016-01-23 DIAGNOSIS — G4733 Obstructive sleep apnea (adult) (pediatric): Secondary | ICD-10-CM | POA: Diagnosis not present

## 2016-01-23 DIAGNOSIS — I10 Essential (primary) hypertension: Secondary | ICD-10-CM | POA: Diagnosis not present

## 2016-01-23 DIAGNOSIS — R509 Fever, unspecified: Secondary | ICD-10-CM | POA: Diagnosis present

## 2016-01-23 DIAGNOSIS — I700 Atherosclerosis of aorta: Secondary | ICD-10-CM | POA: Insufficient documentation

## 2016-01-23 DIAGNOSIS — Z7982 Long term (current) use of aspirin: Secondary | ICD-10-CM | POA: Insufficient documentation

## 2016-01-23 DIAGNOSIS — I708 Atherosclerosis of other arteries: Secondary | ICD-10-CM | POA: Diagnosis not present

## 2016-01-23 DIAGNOSIS — Z9889 Other specified postprocedural states: Secondary | ICD-10-CM

## 2016-01-23 DIAGNOSIS — Z6841 Body Mass Index (BMI) 40.0 and over, adult: Secondary | ICD-10-CM | POA: Diagnosis not present

## 2016-01-23 DIAGNOSIS — M48061 Spinal stenosis, lumbar region without neurogenic claudication: Secondary | ICD-10-CM | POA: Diagnosis not present

## 2016-01-23 DIAGNOSIS — M5030 Other cervical disc degeneration, unspecified cervical region: Secondary | ICD-10-CM | POA: Diagnosis not present

## 2016-01-23 DIAGNOSIS — E1142 Type 2 diabetes mellitus with diabetic polyneuropathy: Secondary | ICD-10-CM | POA: Diagnosis not present

## 2016-01-23 DIAGNOSIS — M5441 Lumbago with sciatica, right side: Principal | ICD-10-CM | POA: Insufficient documentation

## 2016-01-23 DIAGNOSIS — E871 Hypo-osmolality and hyponatremia: Secondary | ICD-10-CM | POA: Diagnosis not present

## 2016-01-23 DIAGNOSIS — R05 Cough: Secondary | ICD-10-CM | POA: Diagnosis not present

## 2016-01-23 DIAGNOSIS — E669 Obesity, unspecified: Secondary | ICD-10-CM | POA: Insufficient documentation

## 2016-01-23 DIAGNOSIS — F329 Major depressive disorder, single episode, unspecified: Secondary | ICD-10-CM | POA: Insufficient documentation

## 2016-01-23 DIAGNOSIS — N400 Enlarged prostate without lower urinary tract symptoms: Secondary | ICD-10-CM | POA: Insufficient documentation

## 2016-01-23 DIAGNOSIS — Z794 Long term (current) use of insulin: Secondary | ICD-10-CM | POA: Insufficient documentation

## 2016-01-23 DIAGNOSIS — Z888 Allergy status to other drugs, medicaments and biological substances status: Secondary | ICD-10-CM | POA: Diagnosis not present

## 2016-01-23 DIAGNOSIS — Z87891 Personal history of nicotine dependence: Secondary | ICD-10-CM | POA: Insufficient documentation

## 2016-01-23 DIAGNOSIS — Z8619 Personal history of other infectious and parasitic diseases: Secondary | ICD-10-CM | POA: Diagnosis not present

## 2016-01-23 DIAGNOSIS — H409 Unspecified glaucoma: Secondary | ICD-10-CM | POA: Diagnosis not present

## 2016-01-23 DIAGNOSIS — Z981 Arthrodesis status: Secondary | ICD-10-CM | POA: Diagnosis not present

## 2016-01-23 DIAGNOSIS — R4182 Altered mental status, unspecified: Secondary | ICD-10-CM | POA: Insufficient documentation

## 2016-01-23 DIAGNOSIS — E785 Hyperlipidemia, unspecified: Secondary | ICD-10-CM | POA: Insufficient documentation

## 2016-01-23 DIAGNOSIS — R059 Cough, unspecified: Secondary | ICD-10-CM

## 2016-01-23 LAB — URINALYSIS, ROUTINE W REFLEX MICROSCOPIC
BACTERIA UA: NONE SEEN
BILIRUBIN URINE: NEGATIVE
Glucose, UA: >=500 mg/dL — AB
Hgb urine dipstick: NEGATIVE
KETONES UR: 20 mg/dL — AB
LEUKOCYTES UA: NEGATIVE
Nitrite: NEGATIVE
PROTEIN: 30 mg/dL — AB
Specific Gravity, Urine: 1.028 (ref 1.005–1.030)
pH: 6 (ref 5.0–8.0)

## 2016-01-23 LAB — I-STAT CG4 LACTIC ACID, ED
LACTIC ACID, VENOUS: 1.99 mmol/L — AB (ref 0.5–1.9)
Lactic Acid, Venous: 2.15 mmol/L (ref 0.5–1.9)

## 2016-01-23 LAB — COMPREHENSIVE METABOLIC PANEL
ALBUMIN: 3.6 g/dL (ref 3.5–5.0)
ALT: 44 U/L (ref 17–63)
ANION GAP: 10 (ref 5–15)
AST: 45 U/L — AB (ref 15–41)
Alkaline Phosphatase: 49 U/L (ref 38–126)
BILIRUBIN TOTAL: 1.7 mg/dL — AB (ref 0.3–1.2)
BUN: 16 mg/dL (ref 6–20)
CO2: 27 mmol/L (ref 22–32)
Calcium: 9.8 mg/dL (ref 8.9–10.3)
Chloride: 97 mmol/L — ABNORMAL LOW (ref 101–111)
Creatinine, Ser: 0.76 mg/dL (ref 0.61–1.24)
GFR calc Af Amer: >60 mL/min (ref >60)
GFR calc non Af Amer: >60 mL/min (ref >60)
GLUCOSE: 235 mg/dL — AB (ref 65–99)
POTASSIUM: 3.7 mmol/L (ref 3.5–5.1)
SODIUM: 134 mmol/L — AB (ref 135–145)
Total Protein: 7.1 g/dL (ref 6.5–8.1)

## 2016-01-23 LAB — CBC
HEMATOCRIT: 38.4 % — AB (ref 39.0–52.0)
HEMOGLOBIN: 13.5 g/dL (ref 13.0–17.0)
MCH: 32.1 pg (ref 26.0–34.0)
MCHC: 35.2 g/dL (ref 30.0–36.0)
MCV: 91.2 fL (ref 78.0–100.0)
Platelets: 155 10*3/uL (ref 150–400)
RBC: 4.21 MIL/uL — ABNORMAL LOW (ref 4.22–5.81)
RDW: 13.1 % (ref 11.5–15.5)
WBC: 8.5 10*3/uL (ref 4.0–10.5)

## 2016-01-23 MED ORDER — ASPIRIN EC 81 MG PO TBEC: 81.0000 mg | DELAYED_RELEASE_TABLET | Freq: Every day | ORAL | Status: DC

## 2016-01-23 MED ORDER — PIPERACILLIN-TAZOBACTAM 3.375 G IVPB 30 MIN
3.3750 g | Freq: Once | INTRAVENOUS | Status: AC
  Administered 2016-01-23: 3.375 g via INTRAVENOUS
  Filled 2016-01-23: qty 50

## 2016-01-23 MED ORDER — TAMSULOSIN HCL 0.4 MG PO CAPS: 0.4000 mg | ORAL_CAPSULE | ORAL | Status: DC

## 2016-01-23 MED ORDER — VANCOMYCIN HCL IN DEXTROSE 1-5 GM/200ML-% IV SOLN: 1000.0000 mg | Freq: Once | INTRAVENOUS | Status: DC

## 2016-01-23 MED ORDER — DOCUSATE SODIUM 100 MG PO CAPS
100.0000 mg | ORAL_CAPSULE | Freq: Two times a day (BID) | ORAL | Status: DC
  Administered 2016-01-23: 100 mg via ORAL
  Filled 2016-01-23: qty 1

## 2016-01-23 MED ORDER — VANCOMYCIN HCL 10 G IV SOLR
2000.0000 mg | Freq: Once | INTRAVENOUS | Status: AC
  Administered 2016-01-23: 2000 mg via INTRAVENOUS
  Filled 2016-01-23: qty 2000

## 2016-01-23 MED ORDER — BENAZEPRIL HCL 20 MG PO TABS: 20.0000 mg | ORAL_TABLET | Freq: Every day | ORAL | Status: DC

## 2016-01-23 MED ORDER — MORPHINE SULFATE (PF) 4 MG/ML IV SOLN: 1.0000 mg | INTRAVENOUS | Status: DC | PRN

## 2016-01-23 MED ORDER — GLIMEPIRIDE 4 MG PO TABS
4.0000 mg | ORAL_TABLET | Freq: Every day | ORAL | Status: DC
  Filled 2016-01-23: qty 1

## 2016-01-23 MED ORDER — PIPERACILLIN-TAZOBACTAM 3.375 G IVPB
3.3750 g | Freq: Three times a day (TID) | INTRAVENOUS | Status: DC
  Administered 2016-01-24: 3.375 g via INTRAVENOUS
  Filled 2016-01-23 (×2): qty 50

## 2016-01-23 MED ORDER — HYDROMORPHONE HCL 2 MG/ML IJ SOLN
1.0000 mg | Freq: Once | INTRAMUSCULAR | Status: AC
  Administered 2016-01-23: 1 mg via INTRAVENOUS
  Filled 2016-01-23: qty 1

## 2016-01-23 MED ORDER — AMLODIPINE BESYLATE 10 MG PO TABS: 10.0000 mg | ORAL_TABLET | Freq: Every day | ORAL | Status: DC

## 2016-01-23 MED ORDER — FLUTICASONE PROPIONATE 50 MCG/ACT NA SUSP: 2.0000 | Freq: Every evening | NASAL | Status: DC

## 2016-01-23 MED ORDER — SODIUM CHLORIDE 0.9 % IV BOLUS (SEPSIS)
1000.0000 mL | Freq: Once | INTRAVENOUS | Status: AC
  Administered 2016-01-23: 1000 mL via INTRAVENOUS

## 2016-01-23 MED ORDER — CYCLOBENZAPRINE HCL 10 MG PO TABS
10.0000 mg | ORAL_TABLET | Freq: Three times a day (TID) | ORAL | Status: DC | PRN
  Administered 2016-01-23 – 2016-01-24 (×2): 10 mg via ORAL
  Filled 2016-01-23 (×2): qty 1

## 2016-01-23 MED ORDER — LINAGLIPTIN 5 MG PO TABS: 5.0000 mg | ORAL_TABLET | Freq: Every day | ORAL | Status: DC

## 2016-01-23 MED ORDER — LORATADINE 10 MG PO TABS
10.0000 mg | ORAL_TABLET | Freq: Every day | ORAL | Status: DC
Start: 2016-01-24 — End: 2016-01-24

## 2016-01-23 MED ORDER — HYDROCODONE-ACETAMINOPHEN 5-325 MG PO TABS
1.0000 | ORAL_TABLET | ORAL | Status: DC | PRN
  Administered 2016-01-23 (×2): 1 via ORAL
  Administered 2016-01-24: 2 via ORAL
  Filled 2016-01-23 (×3): qty 1

## 2016-01-23 MED ORDER — METFORMIN HCL ER 500 MG PO TB24: 1000.0000 mg | ORAL_TABLET | Freq: Two times a day (BID) | ORAL | Status: DC

## 2016-01-23 MED ORDER — HYDROCHLOROTHIAZIDE 25 MG PO TABS: 25.0000 mg | ORAL_TABLET | Freq: Every day | ORAL | Status: DC

## 2016-01-23 MED ORDER — INSULIN GLARGINE 100 UNIT/ML ~~LOC~~ SOLN
75.0000 [IU] | Freq: Every day | SUBCUTANEOUS | Status: DC
  Administered 2016-01-23: 75 [IU] via SUBCUTANEOUS
  Filled 2016-01-23: qty 0.75

## 2016-01-23 MED ORDER — ADULT MULTIVITAMIN W/MINERALS CH: 1.0000 | ORAL_TABLET | Freq: Every day | ORAL | Status: DC

## 2016-01-23 MED ORDER — METFORMIN HCL 500 MG PO TABS
1000.0000 mg | ORAL_TABLET | Freq: Two times a day (BID) | ORAL | Status: DC
  Filled 2016-01-23: qty 2

## 2016-01-23 MED ORDER — LATANOPROST 0.005 % OP SOLN
1.0000 [drp] | Freq: Every day | OPHTHALMIC | Status: DC
  Administered 2016-01-23: 1 [drp] via OPHTHALMIC
  Filled 2016-01-23: qty 2.5

## 2016-01-23 MED ORDER — AMLODIPINE BESY-BENAZEPRIL HCL 10-20 MG PO CAPS: 1.0000 | ORAL_CAPSULE | Freq: Every day | ORAL | Status: DC

## 2016-01-23 MED ORDER — TIMOLOL MALEATE 0.5 % OP SOLN
1.0000 [drp] | Freq: Every evening | OPHTHALMIC | Status: DC
  Administered 2016-01-23: 1 [drp] via OPHTHALMIC
  Filled 2016-01-23: qty 5

## 2016-01-23 MED ORDER — EZETIMIBE-SIMVASTATIN 10-20 MG PO TABS
1.0000 | ORAL_TABLET | Freq: Every day | ORAL | Status: DC
  Filled 2016-01-23: qty 1

## 2016-01-23 MED ORDER — CITALOPRAM HYDROBROMIDE 20 MG PO TABS: 20.0000 mg | ORAL_TABLET | Freq: Every day | ORAL | Status: DC

## 2016-01-23 MED ORDER — VANCOMYCIN HCL IN DEXTROSE 1-5 GM/200ML-% IV SOLN
1000.0000 mg | Freq: Two times a day (BID) | INTRAVENOUS | Status: DC
  Administered 2016-01-24: 1000 mg via INTRAVENOUS
  Filled 2016-01-23: qty 200

## 2016-01-23 MED ORDER — GABAPENTIN 300 MG PO CAPS
600.0000 mg | ORAL_CAPSULE | Freq: Two times a day (BID) | ORAL | Status: DC
  Administered 2016-01-23: 600 mg via ORAL
  Filled 2016-01-23: qty 2

## 2016-01-23 NOTE — H&P (Signed)
Subjective: The patient is a 71 year old white male on whom I performed an L4-5 decompression and fusion 3 days ago. The patient was subsequently discharged. His wife had called my office today and said that the patient was having a lot of pain and was hallucinating. He was advised to go to the ER be evaluated.  Presently the patient is accompanied by his wife. He is in no apparent distress. He complains of the appropriate amount of back pain. He's had no trouble with this wound. He complains of pain in his bilateral legs. He has no weakness. He has been using Percocet 5/325 2 every 4 hours with an inadequate relief.  Past Medical History:  Diagnosis Date  . Allergic rhinitis   . DDD (degenerative disc disease), cervical   . Depressive disorder   . Diabetes mellitus without complication ([place])   . Diabetic peripheral neuropathy ([place])   . Glaucoma   . History of chickenpox   . HNP (herniated nucleus pulposus with myelopathy), thoracic   . Hyperlipidemia   . Hypertension   . Hypertrophy of prostate without urinary obstruction   . Lumbar radiculitis   . Lumbar stenosis   . Obesity   . OSA on CPAP   . Shingles     Past Surgical History:  Procedure Laterality Date  . CERVICAL FUSION  2012  . COLONOSCOPY WITH PROPOFOL N/A 07/01/2015   Procedure: COLONOSCOPY WITH PROPOFOL;  Surgeon: Manya Silvas, MD;  Location: [facility] ENDOSCOPY;  Service: Endoscopy;  Laterality: N/A;  . POSTERIOR CERVICAL FUSION/FORAMINOTOMY  2012  . TONSILLECTOMY      Allergies  Allergen Reactions  . Pilocarpine Hcl Other (See Comments)    UNSPECIFIED MECHANISM Corneal lesions    Social History  Substance Use Topics  . Smoking status: Former Smoker    Quit date: 01/07/1984  . Smokeless tobacco: Never Used  . Alcohol use 1.2 oz/week    2 Glasses of wine per week     Comment: daily    No family history on file. Prior to Admission medications   Medication Sig Start Date End Date Taking? Authorizing Provider   amLODipine-benazepril (LOTREL) 10-20 MG capsule Take 1 capsule by mouth daily.    Historical Provider, MD  aspirin 81 MG tablet Take 81 mg by mouth daily. Will stop prior to procedure    Historical Provider, MD  cetirizine (ZYRTEC) 10 MG tablet Take 10 mg by mouth at bedtime.    Historical Provider, MD  citalopram (CELEXA) 20 MG tablet Take 20 mg by mouth daily.    Historical Provider, MD  cyclobenzaprine (FLEXERIL) 10 MG tablet Take 1 tablet (10 mg total) by mouth 3 (three) times daily as needed for muscle spasms. 01/21/16   Newman Pies, MD  docusate sodium (COLACE) 100 MG capsule Take 1 capsule (100 mg total) by mouth 2 (two) times daily. 01/21/16   Newman Pies, MD  ezetimibe-simvastatin (VYTORIN) 10-20 MG tablet Take 1 tablet by mouth daily.     Historical Provider, MD  fluticasone (FLONASE) 50 MCG/ACT nasal spray Place 2 sprays into both nostrils every evening.     Historical Provider, MD  gabapentin (NEURONTIN) 300 MG capsule 2 capsules daily 01/15/15   Historical Provider, MD  glimepiride (AMARYL) 4 MG tablet Take 4 mg by mouth daily with breakfast.    Historical Provider, MD  hydrochlorothiazide (HYDRODIURIL) 25 MG tablet Take 25 mg by mouth daily.    Historical Provider, MD  insulin glargine (LANTUS) 100 UNIT/ML injection Inject 75 Units into  the skin at bedtime.    Historical Provider, MD  latanoprost (XALATAN) 0.005 % ophthalmic solution Place 1 drop into both eyes at bedtime.     Historical Provider, MD  metformin (FORTAMET) 1000 MG (OSM) 24 hr tablet Take 1,000 mg by mouth 2 (two) times daily with a meal.    Historical Provider, MD  Multiple Vitamin (MULTIVITAMIN) tablet Take 1 tablet by mouth daily.    Historical Provider, MD  oxyCODONE-acetaminophen (PERCOCET/ROXICET) 5-325 MG tablet Take 1-2 tablets by mouth every 4 (four) hours as needed for moderate pain. 01/21/16   Newman Pies, MD  sitaGLIPtin (JANUVIA) 100 MG tablet Take 100 mg by mouth daily.    Historical Provider, MD   tamsulosin (FLOMAX) 0.4 MG CAPS capsule Take 0.4 mg by mouth every other day.     Historical Provider, MD  timolol (TIMOPTIC) 0.5 % ophthalmic solution Place 1 drop into both eyes every evening.     Historical Provider, MD  triamcinolone cream (KENALOG) 0.1 % Apply 1 application topically as needed (rash).     Historical Provider, MD     Review of Systems  Positive ROS: As above  All other systems have been reviewed and were otherwise negative with the exception of those mentioned in the HPI and as above.  Objective: Vital signs in last 24 hours: Temp:  [99.1 F (37.3 C)-101.5 F (38.6 C)] 101.5 F (38.6 C) (01/11 1722) Pulse Rate:  [102-112] 110 (01/11 1745) Resp:  [15-18] 15 (01/11 1745) BP: (148-179)/(70-74) 148/71 (01/11 1745) SpO2:  [97 %-99 %] 97 % (01/11 1745) Weight:  [108.9 kg (240 lb)] 108.9 kg (240 lb) (01/11 1501)  General Appearance: Alert, pleasant, no apparent distress  Back exam: The patient's lumbar incision is healing well. There is no signs of infection, drainage, etc.  Neurologic exam: The patient is alert and oriented 3. His strength is grossly normal in his lower extremities. His sensation is grossly normal in his lower extremities.    Data Review Lab Results  Component Value Date   WBC 8.5 01/23/2016   HGB 13.5 01/23/2016   HCT 38.4 (L) 01/23/2016   MCV 91.2 01/23/2016   PLT 155 01/23/2016   Lab Results  Component Value Date   NA 134 (L) 01/23/2016   K 3.7 01/23/2016   CL 97 (L) 01/23/2016   CO2 27 01/23/2016   BUN 16 01/23/2016   CREATININE 0.76 01/23/2016   GLUCOSE 235 (H) 01/23/2016   No results found for: INR, PROTIME  Assessment/Plan: Postop day #3: The patient does not appear particularly ill. He did have a fever to 101.5. His white count is normal. His wound looks fine. He has minimal hyponatremia. I will plan to observe the patient in the hospital. If he does well we will likely send him home tomorrow. I will adjust his pain  medications to hopefully get better pain control. I have answered all the patient's, and his wife's, questions.   Ophelia Charter 01/23/2016 6:33 PM

## 2016-01-23 NOTE — ED Notes (Signed)
Critical Lactic Acid called from lab

## 2016-01-23 NOTE — ED Triage Notes (Signed)
Per Pt's family, Pt is coming from home with complaints of lower back pain after surgery with some Altered Mental Status that was noted by wife due to the percocet. Pt tried to get up today and fell. Called Surgeon and was requested to come here to have Xray completed and then be evaluated for a different pain medication.

## 2016-01-23 NOTE — ED Provider Notes (Signed)
[facility] DEPT Provider Note   CSN: [id number] Arrival date & time: 01/23/16  1423     History   Chief Complaint Chief Complaint  Patient presents with  . Back Pain  . Altered Mental Status    HPI Miguel White is a 71 y.o. male.  HPI  71 year old gentleman with a history of degenerative disc disease status post lumbar fusion 4 days ago by neurosurgery presents to the ED with uncontrolled back pain since the surgery. Patient is on Percocet which has not provided any symptom relief. Patient had an radicular pain down the right leg which is chronic for the patient. He does endorse productive cough for 2 days. Also endorsing constipation. Denies any fevers, chills, nausea, vomiting, diarrhea, dysuria.  Patient also reports that due to the pain he has been unable to sleep for the past 48 hours.  Past Medical History:  Diagnosis Date  . Allergic rhinitis   . DDD (degenerative disc disease), cervical   . Depressive disorder   . Diabetes mellitus without complication ([place])   . Diabetic peripheral neuropathy ([place])   . Glaucoma   . History of chickenpox   . HNP (herniated nucleus pulposus with myelopathy), thoracic   . Hyperlipidemia   . Hypertension   . Hypertrophy of prostate without urinary obstruction   . Lumbar radiculitis   . Lumbar stenosis   . Obesity   . OSA on CPAP   . Shingles     Patient Active Problem List   Diagnosis Date Noted  . Fever 01/23/2016  . Spondylolisthesis of lumbar region 01/20/2016    Past Surgical History:  Procedure Laterality Date  . CERVICAL FUSION  2012  . COLONOSCOPY WITH PROPOFOL N/A 07/01/2015   Procedure: COLONOSCOPY WITH PROPOFOL;  Surgeon: Manya Silvas, MD;  Location: [facility] ENDOSCOPY;  Service: Endoscopy;  Laterality: N/A;  . POSTERIOR CERVICAL FUSION/FORAMINOTOMY  2012  . TONSILLECTOMY         Home Medications    Prior to Admission medications   Medication Sig Start Date End Date Taking? Authorizing Provider    amLODipine-benazepril (LOTREL) 10-20 MG capsule Take 1 capsule by mouth daily.    Historical Provider, MD  aspirin 81 MG tablet Take 81 mg by mouth daily. Will stop prior to procedure    Historical Provider, MD  cetirizine (ZYRTEC) 10 MG tablet Take 10 mg by mouth at bedtime.    Historical Provider, MD  citalopram (CELEXA) 20 MG tablet Take 20 mg by mouth daily.    Historical Provider, MD  cyclobenzaprine (FLEXERIL) 10 MG tablet Take 1 tablet (10 mg total) by mouth 3 (three) times daily as needed for muscle spasms. 01/21/16   Newman Pies, MD  docusate sodium (COLACE) 100 MG capsule Take 1 capsule (100 mg total) by mouth 2 (two) times daily. 01/21/16   Newman Pies, MD  ezetimibe-simvastatin (VYTORIN) 10-20 MG tablet Take 1 tablet by mouth daily.     Historical Provider, MD  fluticasone (FLONASE) 50 MCG/ACT nasal spray Place 2 sprays into both nostrils every evening.     Historical Provider, MD  gabapentin (NEURONTIN) 300 MG capsule 2 capsules daily 01/15/15   Historical Provider, MD  glimepiride (AMARYL) 4 MG tablet Take 4 mg by mouth daily with breakfast.    Historical Provider, MD  hydrochlorothiazide (HYDRODIURIL) 25 MG tablet Take 25 mg by mouth daily.    Historical Provider, MD  insulin glargine (LANTUS) 100 UNIT/ML injection Inject 75 Units into the skin at bedtime.  Historical Provider, MD  latanoprost (XALATAN) 0.005 % ophthalmic solution Place 1 drop into both eyes at bedtime.     Historical Provider, MD  metformin (FORTAMET) 1000 MG (OSM) 24 hr tablet Take 1,000 mg by mouth 2 (two) times daily with a meal.    Historical Provider, MD  Multiple Vitamin (MULTIVITAMIN) tablet Take 1 tablet by mouth daily.    Historical Provider, MD  sitaGLIPtin (JANUVIA) 100 MG tablet Take 100 mg by mouth daily.    Historical Provider, MD  tamsulosin (FLOMAX) 0.4 MG CAPS capsule Take 0.4 mg by mouth every other day.     Historical Provider, MD  timolol (TIMOPTIC) 0.5 % ophthalmic solution Place 1 drop  into both eyes every evening.     Historical Provider, MD  triamcinolone cream (KENALOG) 0.1 % Apply 1 application topically as needed (rash).     Historical Provider, MD    Family History No family history on file.  Social History Social History  Substance Use Topics  . Smoking status: Former Smoker    Quit date: 01/07/1984  . Smokeless tobacco: Never Used  . Alcohol use 1.2 oz/week    2 Glasses of wine per week     Comment: daily     Allergies   Pilocarpine hcl   Review of Systems Review of Systems Ten systems are reviewed and are negative for acute change except as noted in the HPI   Physical Exam Updated Vital Signs BP 179/70 (BP Location: Right Arm)   Pulse 112   Temp 99.1 F (37.3 C) (Oral)   Resp 16   Ht 5\' 5"  (1.651 m)   Wt 240 lb (108.9 kg)   SpO2 99%   BMI 39.94 kg/m   Physical Exam  Constitutional: He is oriented to person, place, and time. He appears well-developed and well-nourished. No distress.  HENT:  Head: Normocephalic and atraumatic.  Nose: Nose normal.  Eyes: Conjunctivae and EOM are normal. Pupils are equal, round, and reactive to light. Right eye exhibits no discharge. Left eye exhibits no discharge. No scleral icterus.  Neck: Normal range of motion. Neck supple.  Cardiovascular: Normal rate and regular rhythm.  Exam reveals no gallop and no friction rub.   No murmur heard. Pulmonary/Chest: Effort normal and breath sounds normal. No stridor. No respiratory distress. He has no rales.  Abdominal: Soft. He exhibits no distension. There is no tenderness.  Musculoskeletal: He exhibits no edema or tenderness.  Neurological: He is alert and oriented to person, place, and time.  Skin: Skin is warm and dry. No rash noted. He is not diaphoretic. No erythema.     Hot to the touch  Psychiatric: He has a normal mood and affect.  Vitals reviewed.    ED Treatments / Results  Labs (all labs ordered are listed, but only abnormal results are  displayed) Labs Reviewed  COMPREHENSIVE METABOLIC PANEL - Abnormal; Notable for the following:       Result Value   Sodium 134 (*)    Chloride 97 (*)    Glucose, Bld 235 (*)    AST 45 (*)    Total Bilirubin 1.7 (*)    All other components within normal limits  CBC - Abnormal; Notable for the following:    RBC 4.21 (*)    HCT 38.4 (*)    All other components within normal limits  URINALYSIS, ROUTINE W REFLEX MICROSCOPIC - Abnormal; Notable for the following:    Glucose, UA >=500 (*)    Ketones,  ur 20 (*)    Protein, ur 30 (*)    Squamous Epithelial / LPF 0-5 (*)    All other components within normal limits  I-STAT CG4 LACTIC ACID, ED - Abnormal; Notable for the following:    Lactic Acid, Venous 2.15 (*)    All other components within normal limits  I-STAT CG4 LACTIC ACID, ED - Abnormal; Notable for the following:    Lactic Acid, Venous 1.99 (*)    All other components within normal limits  CULTURE, BLOOD (ROUTINE X 2)  CULTURE, BLOOD (ROUTINE X 2)  URINE CULTURE    EKG  EKG Interpretation  Date/Time:  Thursday January 23 2016 17:58:12 EST Ventricular Rate:  111 PR Interval:    QRS Duration: 94 QT Interval:  384 QTC Calculation: 522 R Axis:     Text Interpretation:  Sinus tachycardia Borderline T abnormalities, inferior leads Prolonged QT interval Baseline wander in lead(s) V1 V2 No significant change since last tracing Confirmed by [facility] MD, PEDRO ([id number]) on 01/23/2016 6:04:58 PM       Radiology Dg Chest 2 View  Result Date: 01/23/2016 CLINICAL DATA:  Low back pain after surgery with altered mental status mild fever and congestion EXAM: CHEST  2 VIEW COMPARISON:  None. FINDINGS: Cervical spine hardware visualized. No acute pulmonary infiltrate, consolidation or effusion is visualized. Normal cardiomediastinal silhouette. Atherosclerosis of the aorta. No pneumothorax. Increased opacity over the lower spine likely relates to degenerative change. IMPRESSION: No  radiographic evidence for acute cardiopulmonary abnormality. Electronically Signed   By: Donavan Foil M.D.   On: 01/23/2016 18:23   Dg Lumbar Spine Complete  Result Date: 01/23/2016 CLINICAL DATA:  Fall this morning.  Lumbar surgery 4 days ago. EXAM: LUMBAR SPINE - COMPLETE 4+ VIEW COMPARISON:  01/20/2016 FINDINGS: Posterolateral rod and pedicle screw fixation at the L4-5 level bilaterally with interbody prosthesis. No new malalignment or new fracture seen, there was pre-existing mild grade 1 retrolisthesis at L2- 3 and anterolisthesis at L4-5. Right upper quadrant calcification likely a gallstone. Aortoiliac atherosclerotic vascular disease. IMPRESSION: 1. No acute bony findings are identified. No complicating feature related to the hardware and prosthetic at the L4-5 level. 2.  Aortoiliac atherosclerotic vascular disease. Electronically Signed   By: Van Clines M.D.   On: 01/23/2016 16:04    Procedures Procedures (including critical care time)  Medications Ordered in ED Medications  vancomycin (VANCOCIN) 2,000 mg in sodium chloride 0.9 % 500 mL IVPB (2,000 mg Intravenous New Bag/Given 01/23/16 1748)  piperacillin-tazobactam (ZOSYN) IVPB 3.375 g (not administered)  vancomycin (VANCOCIN) IVPB 1000 mg/200 mL premix (not administered)  loratadine (CLARITIN) tablet 10 mg (not administered)  cyclobenzaprine (FLEXERIL) tablet 10 mg (not administered)  docusate sodium (COLACE) capsule 100 mg (not administered)  gabapentin (NEURONTIN) capsule 600 mg (not administered)  insulin glargine (LANTUS) injection 75 Units (not administered)  amLODipine-benazepril (LOTREL) 10-20 MG per capsule 1 capsule (not administered)  aspirin tablet 81 mg (not administered)  citalopram (CELEXA) tablet 20 mg (not administered)  ezetimibe-simvastatin (VYTORIN) 10-20 MG per tablet 1 tablet (not administered)  fluticasone (FLONASE) 50 MCG/ACT nasal spray 2 spray (not administered)  glimepiride (AMARYL) tablet 4 mg  (not administered)  hydrochlorothiazide (HYDRODIURIL) tablet 25 mg (not administered)  latanoprost (XALATAN) 0.005 % ophthalmic solution 1 drop (not administered)  metFORMIN (GLUCOPHAGE-XR) 24 hr tablet 1,000 mg (not administered)  multivitamin tablet 1 tablet (not administered)  linagliptin (TRADJENTA) tablet 5 mg (not administered)  tamsulosin (FLOMAX) capsule 0.4 mg (not administered)  timolol (TIMOPTIC)  0.5 % ophthalmic solution 1 drop (not administered)  sodium chloride 0.9 % bolus 1,000 mL (1,000 mLs Intravenous New Bag/Given 01/23/16 1754)  piperacillin-tazobactam (ZOSYN) IVPB 3.375 g (0 g Intravenous Stopped 01/23/16 1847)  HYDROmorphone (DILAUDID) injection 1 mg (1 mg Intravenous Given 01/23/16 1820)     Initial Impression / Assessment and Plan / ED Course  I have reviewed the triage vital signs and the nursing notes.  Pertinent labs & imaging results that were available during my care of the patient were reviewed by me and considered in my medical decision making (see chart for details).  Clinical Course as of Jan 23 1908  Thu Jan 23, 2016  1725 Rectal temp 101.5 with tachycardia. Lactic acid >2 on triage labs. No current source, but dose have cough and recent surgery. Code sepsis initiated. Started on IVF bolus and empiric Abx.  SBP>90 and lactic <4. No need for 30cc/kg at this time.  [PC]  1908 CXR w/o PNA. Still waiting on urine. Dr. Arnoldo Morale saw the patient in the ED and will admit for further management.  [PC]    Clinical Course User Index [PC] Fatima Blank, MD      Final Clinical Impressions(s) / ED Diagnoses   Final diagnoses:  Fever, unspecified fever cause  Cough  Acute midline low back pain with right-sided sciatica  Recent major surgery      Fatima Blank, MD 01/23/16 1910

## 2016-01-23 NOTE — Progress Notes (Signed)
Pharmacy Antibiotic Note  Miguel White is a 71 y.o. male admitted on 01/23/2016 with low back pain. Pt had lumbar decompression and fusion of L4-5 last week. Pt has minimally elevated lactic acid, 2.1 > 1.9, normal wbc, Tm 101.5. Will start broad spectrum antibiotics.    Plan: -Vancomycin 2 g IV x1 then 1g/12h -Zosyns 3.375 g IV q8h -Monitor renal fx, cultures, VT as needed   Height: 5\' 5"  (165.1 cm) Weight: 240 lb (108.9 kg) IBW/kg (Calculated) : 61.5  Temp (24hrs), Avg:99.1 F (37.3 C), Min:99.1 F (37.3 C), Max:99.1 F (37.3 C)   Recent Labs Lab 01/21/16 0457 01/23/16 1504 01/23/16 1527  WBC 8.4 8.5  --   CREATININE 0.66 0.76  --   LATICACIDVEN  --   --  2.15*    Estimated Creatinine Clearance: 100.6 mL/min (by C-G formula based on SCr of 0.76 mg/dL).    Allergies  Allergen Reactions  . Pilocarpine Hcl Other (See Comments)    UNSPECIFIED MECHANISM Corneal lesions    Antimicrobials this admission: 1/11 vancomycin > 1/11 zosyn >   Dose adjustments this admission: N/A   Microbiology results: 1/11 blood cx: 1/11 urine cx:   Thank you for allowing pharmacy to be a part of this patient's care.   Harvel Quale 01/23/2016 5:30 PM

## 2016-01-23 NOTE — ED Notes (Signed)
Patient transported to X-ray 

## 2016-01-23 NOTE — ED Notes (Signed)
ACTIVATED CODE SEPSIS WITH CARELINK@ 17:25

## 2016-01-24 LAB — URINE CULTURE: CULTURE: NO GROWTH

## 2016-01-24 LAB — RESPIRATORY PANEL BY PCR

## 2016-01-24 LAB — GLUCOSE, CAPILLARY
Glucose-Capillary: 197 mg/dL — ABNORMAL HIGH (ref 65–99)
Glucose-Capillary: 210 mg/dL — ABNORMAL HIGH (ref 65–99)

## 2016-01-24 LAB — CBC
HEMATOCRIT: 35.7 % — AB (ref 39.0–52.0)
Hemoglobin: 12.5 g/dL — ABNORMAL LOW (ref 13.0–17.0)
MCH: 31.9 pg (ref 26.0–34.0)
MCHC: 35 g/dL (ref 30.0–36.0)
MCV: 91.1 fL (ref 78.0–100.0)
PLATELETS: 165 10*3/uL (ref 150–400)
RBC: 3.92 MIL/uL — ABNORMAL LOW (ref 4.22–5.81)
RDW: 13 % (ref 11.5–15.5)
WBC: 7.3 10*3/uL (ref 4.0–10.5)

## 2016-01-24 LAB — BASIC METABOLIC PANEL
ANION GAP: 12 (ref 5–15)
BUN: 8 mg/dL (ref 6–20)
CALCIUM: 8.9 mg/dL (ref 8.9–10.3)
CO2: 25 mmol/L (ref 22–32)
CREATININE: 0.64 mg/dL (ref 0.61–1.24)
Chloride: 99 mmol/L — ABNORMAL LOW (ref 101–111)
Glucose, Bld: 179 mg/dL — ABNORMAL HIGH (ref 65–99)
Potassium: 3.2 mmol/L — ABNORMAL LOW (ref 3.5–5.1)
Sodium: 136 mmol/L (ref 135–145)

## 2016-01-24 MED ORDER — SODIUM CHLORIDE 0.9% FLUSH: 3.0000 mL | INTRAVENOUS | Status: DC | PRN

## 2016-01-24 MED ORDER — SODIUM CHLORIDE 0.9 % IV SOLN
250.0000 mL | INTRAVENOUS | Status: DC
  Administered 2016-01-24: 250 mL via INTRAVENOUS

## 2016-01-24 MED ORDER — PHENOL 1.4 % MT LIQD: 1.0000 | OROMUCOSAL | Status: DC | PRN

## 2016-01-24 MED ORDER — MENTHOL 3 MG MT LOZG: 1.0000 | LOZENGE | OROMUCOSAL | Status: DC | PRN

## 2016-01-24 MED ORDER — OXYCODONE HCL 5 MG PO TABS
10.0000 mg | ORAL_TABLET | ORAL | Status: DC | PRN
  Administered 2016-01-24: 10 mg via ORAL
  Filled 2016-01-24: qty 2

## 2016-01-24 MED ORDER — OXYCODONE HCL 10 MG PO TABS: 10.0000 mg | ORAL_TABLET | ORAL | 0 refills | Status: DC | PRN

## 2016-01-24 MED ORDER — ACETAMINOPHEN 650 MG RE SUPP: 650.0000 mg | RECTAL | Status: DC | PRN

## 2016-01-24 MED ORDER — ACETAMINOPHEN 325 MG PO TABS: 650.0000 mg | ORAL_TABLET | ORAL | Status: DC | PRN

## 2016-01-24 MED ORDER — INSULIN ASPART 100 UNIT/ML ~~LOC~~ SOLN
0.0000 [IU] | SUBCUTANEOUS | Status: DC
  Administered 2016-01-24: 7 [IU] via SUBCUTANEOUS

## 2016-01-24 MED ORDER — DOCUSATE SODIUM 100 MG PO CAPS: 100.0000 mg | ORAL_CAPSULE | Freq: Two times a day (BID) | ORAL | Status: DC

## 2016-01-24 MED ORDER — ONDANSETRON HCL 4 MG/2ML IJ SOLN: 4.0000 mg | INTRAMUSCULAR | Status: DC | PRN

## 2016-01-24 MED ORDER — SODIUM CHLORIDE 0.9% FLUSH
3.0000 mL | Freq: Two times a day (BID) | INTRAVENOUS | Status: DC
  Administered 2016-01-24: 3 mL via INTRAVENOUS

## 2016-01-24 MED ORDER — GABAPENTIN 300 MG PO CAPS: 600.0000 mg | ORAL_CAPSULE | Freq: Two times a day (BID) | ORAL | 1 refills | Status: DC

## 2016-01-24 MED ORDER — BISACODYL 10 MG RE SUPP: 10.0000 mg | Freq: Every day | RECTAL | Status: DC | PRN

## 2016-01-24 NOTE — Progress Notes (Signed)
Pt admitted from ED with c/o of pain post surgery, pt alert and oriented, still c/o of pain in the back, pt settled in room, prefers to sit in chair, chair alarm activated with call light at pt's side, v/s stable, will continue to monitor. Obasogie-Asidi, Jashae Wiggs Efe

## 2016-01-24 NOTE — Care Management Note (Signed)
Case Management Note  Patient Details  Name: CHAE ROBLEY MRN: [id number] Date of Birth: 08/27/1945  Subjective/Objective:              Patient presented with pain, fever. Lives at home with wife.  Per Dr Arnoldo Morale, patient discharging back home. No needs identified at this time.       Action/Plan:   Expected Discharge Date:  01/24/16               Expected Discharge Plan:     In-House Referral:     Discharge planning Services     Post Acute Care Choice:    Choice offered to:     DME Arranged:    DME Agency:     HH Arranged:    HH Agency:     Status of Service:     If discussed at [employer], dates discussed:    Additional Comments:  Rolm Baptise, RN 01/24/2016, 10:48 AM

## 2016-01-24 NOTE — Discharge Summary (Signed)
Physician Discharge Summary  Patient ID: Miguel White MRN: [id number] DOB/AGE: Jan 20, 1945 71 y.o.  Admit date: 01/23/2016 Discharge date: 01/24/2016  Admission Diagnoses:Lumbago, fever  Discharge Diagnoses: The same Active Problems:   Fever   Discharged Condition: good  Hospital Course: The patient was admitted for pain control and further workup of a low-grade fever. I increased the patient's Neurontin and oxycodone. We obtain better pain management.  The patient's low-grade fever is worked up with a chest x-ray. This turned out normal. His white count was normal. His wound looks fine.  This morning the patient has requested discharge to home. He looks and feels better. He was given written and oral discharge instructions. All his questions were answered.  Consults: None Significant Diagnostic Studies: Chest x-ray Treatments: Observation Discharge Exam: Blood pressure (!) 148/63, pulse (!) 108, temperature 100.3 F (37.9 C), temperature source Oral, resp. rate 18, height 5\' 6"  (1.676 m), weight 116.8 kg (257 lb 8 oz), SpO2 96 %. The patient is alert and pleasant. He looks better. His wound is healing well. His strength is normal.  Disposition: Home  Discharge Instructions    Call MD for:  difficulty breathing, headache or visual disturbances    Complete by:  As directed    Call MD for:  extreme fatigue    Complete by:  As directed    Call MD for:  hives    Complete by:  As directed    Call MD for:  persistant dizziness or light-headedness    Complete by:  As directed    Call MD for:  persistant nausea and vomiting    Complete by:  As directed    Call MD for:  redness, tenderness, or signs of infection (pain, swelling, redness, odor or green/yellow discharge around incision site)    Complete by:  As directed    Call MD for:  severe uncontrolled pain    Complete by:  As directed    Call MD for:  temperature >100.4    Complete by:  As directed    Diet - low sodium  heart healthy    Complete by:  As directed    Discharge instructions    Complete by:  As directed    Call [phone] for a followup appointment. Take a stool softener while you are using pain medications.   Driving Restrictions    Complete by:  As directed    Do not drive for 2 weeks.   Increase activity slowly    Complete by:  As directed    Lifting restrictions    Complete by:  As directed    Do not lift more than 5 pounds. No excessive bending or twisting.   May shower / Bathe    Complete by:  As directed    He may shower after the pain she is removed 3 days after surgery. Leave the incision alone.   No dressing needed    Complete by:  As directed      Allergies as of 01/24/2016      Reactions   Pilocarpine Hcl Other (See Comments)   UNSPECIFIED MECHANISM Corneal lesions      Medication List    TAKE these medications   amLODipine-benazepril 10-20 MG capsule Commonly known as:  LOTREL Take 1 capsule by mouth every other day.   cetirizine 10 MG tablet Commonly known as:  ZYRTEC Take 10 mg by mouth daily.   citalopram 20 MG tablet Commonly known as:  CELEXA Take 20 mg  by mouth daily.   cyclobenzaprine 10 MG tablet Commonly known as:  FLEXERIL Take 1 tablet (10 mg total) by mouth 3 (three) times daily as needed for muscle spasms.   docusate sodium 100 MG capsule Commonly known as:  COLACE Take 1 capsule (100 mg total) by mouth 2 (two) times daily. What changed:  when to take this   ezetimibe-simvastatin 10-20 MG tablet Commonly known as:  VYTORIN Take 1 tablet by mouth daily at 6 PM.   fluticasone 50 MCG/ACT nasal spray Commonly known as:  FLONASE Place 2 sprays into both nostrils every evening.   gabapentin 300 MG capsule Commonly known as:  NEURONTIN Take 2 capsules (600 mg total) by mouth 2 (two) times daily. What changed:  See the new instructions.   glimepiride 4 MG tablet Commonly known as:  AMARYL Take 4 mg by mouth daily with breakfast.    hydrochlorothiazide 25 MG tablet Commonly known as:  HYDRODIURIL Take 25 mg by mouth every evening.   insulin glargine 100 UNIT/ML injection Commonly known as:  LANTUS Inject 75 Units into the skin at bedtime.   latanoprost 0.005 % ophthalmic solution Commonly known as:  XALATAN Place 1 drop into both eyes at bedtime.   metFORMIN 1000 MG tablet Commonly known as:  GLUCOPHAGE Take 1,000 mg by mouth 2 (two) times daily with a meal.   multivitamin tablet Take 1 tablet by mouth daily.   Oxycodone HCl 10 MG Tabs Take 1 tablet (10 mg total) by mouth every 3 (three) hours as needed for moderate pain.   sitaGLIPtin 100 MG tablet Commonly known as:  JANUVIA Take 100 mg by mouth daily.   timolol 0.5 % ophthalmic solution Commonly known as:  TIMOPTIC Place 1 drop into both eyes every evening.   triamcinolone cream 0.1 % Commonly known as:  KENALOG Apply 1 application topically as needed (rash).        SignedOphelia Charter 01/24/2016, 7:44 AM

## 2016-01-28 LAB — CULTURE, BLOOD (ROUTINE X 2)
CULTURE: NO GROWTH
Culture: NO GROWTH

## 2016-05-18 ENCOUNTER — Encounter: Payer: Self-pay | Admitting: *Deleted

## 2016-05-21 NOTE — Discharge Instructions (Signed)
General Anesthesia, Adult, Care After These instructions provide you with information about caring for yourself after your procedure. Your health care provider may also give you more specific instructions. Your treatment has been planned according to current medical practices, but problems sometimes occur. Call your health care provider if you have any problems or questions after your procedure. What can I expect after the procedure? After the procedure, it is common to have:  Vomiting.  A sore throat.  Mental slowness. It is common to feel:  Nauseous.  Cold or shivery.  Sleepy.  Tired.  Sore or achy, even in parts of your body where you did not have surgery. Follow these instructions at home: For at least 24 hours after the procedure:   Do not:  Participate in activities where you could fall or become injured.  Drive.  Use heavy machinery.  Drink alcohol.  Take sleeping pills or medicines that cause drowsiness.  Make important decisions or sign legal documents.  Take care of children on your own.  Rest. Eating and drinking   If you vomit, drink water, juice, or soup when you can drink without vomiting.  Drink enough fluid to keep your urine clear or pale yellow.  Make sure you have little or no nausea before eating solid foods.  Follow the diet recommended by your health care provider. General instructions   Have a responsible adult stay with you until you are awake and alert.  Return to your normal activities as told by your health care provider. Ask your health care provider what activities are safe for you.  Take over-the-counter and prescription medicines only as told by your health care provider.  If you smoke, do not smoke without supervision.  Keep all follow-up visits as told by your health care provider. This is important. Contact a health care provider if:  You continue to have nausea or vomiting at home, and medicines are not helpful.  You  cannot drink fluids or start eating again.  You cannot urinate after 8-12 hours.  You develop a skin rash.  You have fever.  You have increasing redness at the site of your procedure. Get help right away if:  You have difficulty breathing.  You have chest pain.  You have unexpected bleeding.  You feel that you are having a life-threatening or urgent problem. This information is not intended to replace advice given to you by your health care provider. Make sure you discuss any questions you have with your health care provider. Document Released: 04/06/2000 Document Revised: 06/03/2015 Document Reviewed: 12/13/2014 Elsevier Interactive Patient Education  2017 [employer] Dennie Maizes, MD  AFTER YOUR EYE SURGERY, THER ARE MANY THINGS [place] YOU, THE PATIENT, CAN DO TO ASSURE THE BEST POSSIBLE RESULT FROM YOUR OPERATION.  THIS SHEET SHOULD BE REFERRED TO WHENEVER QUESTIONS ARISE.  IF THERE ARE ANY QUESTIONS NOT ANSWERED HERE, DO NOT HESITATE TO CALL OUR OFFICE AT [phone] OR [phone].  THERE IS ALWAYS [facility] AVAILABLE TO CALL IF QUESTIONS OR PROBLEMS ARISE.  VISION: Your vision may be blurred and out of focus after surgery until you are able to stop using your ointment, swelling resolves and your eye(s) heal. This may take 1 to 2 weeks at the least.  If your vision becomes gradually more dim or dark, this is not normal and you need to call our office immediately.  EYE CARE: For the first 48 hours after surgery, use ice packs frequently - 20  minutes on, 20 minutes off - to help reduce swelling and bruising.  Small bags of frozen peas or corn make good ice packs along with cloths soaked in ice water.  If you are wearing a patch or other type of dressing following surgery, keep this on for the amount of time specified by your doctor.  For the first week following surgery, you will need to treat your stitches with great care.  If is  OK to shower, but take care to not allow soapy water to run into your eye(s) to help reduce changes of infection.  You may gently clean the eyelashes and around the eye(s) with cotton balls and sterile water, BUT DO NOT RUB THE STITCHES VIGOROUSLY.  Keeping your stitches moist with ointment will help promote healing with minimal scar formation.  ACTIVITY: When you leave the surgery center, you should go home, rest and be inactive.  The eye(s) may feel scratchy and keeping the eyes closed will allow for faster healing.  The first week following surgery, avoid straining (anything making the face turn red) or lifting over 20 pounds.  Additionally, avoid bending which causes your head to go below your waist.  Using your eyes will NOT harm them, so feel free to read, watch television, use the computer, etc as desired.  Driving depends on each individual, so check with your doctor if you have questions about driving.  MEDICATIONS:  You will be given a prescription for an ointment to use 4 times a day on your stitches.  You can use the ointment in your eyes if they feel scratchy or irritated.  If you eyelid(s) dont close completely when you sleep, put some ointment in your eyes before bedtime.  EMERGENCY: If you experience SEVERE EYE PAIN OR HEADACHE UNRELIEVED BY TYLENOL OR PERCOCET, NAUSEA OR VOMITING, WORSENING REDNESS, OR WORSENING VISION (ESPECIALLY VISION THAT WA INITIALLY BETTER) CALL [phone] OR [phone] DURING BUSINESS HOURS OR AFTER HOURS.

## 2016-05-26 ENCOUNTER — Encounter
Admission: RE | Disposition: A | Discharge status: Home / Self Care | Payer: Self-pay | Source: Ambulatory Visit | Attending: Ophthalmology

## 2016-05-26 ENCOUNTER — Ambulatory Visit: Payer: Medicare Other | Admitting: Anesthesiology

## 2016-05-26 ENCOUNTER — Ambulatory Visit
Admission: RE | Admit: 2016-05-26 | Discharge: 2016-05-26 | Disposition: A | Discharge status: Home / Self Care | Payer: Medicare Other | Source: Ambulatory Visit | Attending: Ophthalmology | Admitting: Ophthalmology

## 2016-05-26 DIAGNOSIS — H02831 Dermatochalasis of right upper eyelid: Secondary | ICD-10-CM | POA: Diagnosis not present

## 2016-05-26 DIAGNOSIS — Z981 Arthrodesis status: Secondary | ICD-10-CM | POA: Insufficient documentation

## 2016-05-26 DIAGNOSIS — Z888 Allergy status to other drugs, medicaments and biological substances status: Secondary | ICD-10-CM | POA: Diagnosis not present

## 2016-05-26 DIAGNOSIS — E1140 Type 2 diabetes mellitus with diabetic neuropathy, unspecified: Secondary | ICD-10-CM | POA: Diagnosis not present

## 2016-05-26 DIAGNOSIS — M549 Dorsalgia, unspecified: Secondary | ICD-10-CM | POA: Diagnosis not present

## 2016-05-26 DIAGNOSIS — Z87891 Personal history of nicotine dependence: Secondary | ICD-10-CM | POA: Diagnosis not present

## 2016-05-26 DIAGNOSIS — G4730 Sleep apnea, unspecified: Secondary | ICD-10-CM | POA: Diagnosis not present

## 2016-05-26 DIAGNOSIS — F329 Major depressive disorder, single episode, unspecified: Secondary | ICD-10-CM | POA: Insufficient documentation

## 2016-05-26 DIAGNOSIS — H02839 Dermatochalasis of unspecified eye, unspecified eyelid: Secondary | ICD-10-CM | POA: Diagnosis present

## 2016-05-26 DIAGNOSIS — N400 Enlarged prostate without lower urinary tract symptoms: Secondary | ICD-10-CM | POA: Diagnosis not present

## 2016-05-26 DIAGNOSIS — H02834 Dermatochalasis of left upper eyelid: Secondary | ICD-10-CM | POA: Insufficient documentation

## 2016-05-26 DIAGNOSIS — H02403 Unspecified ptosis of bilateral eyelids: Secondary | ICD-10-CM | POA: Insufficient documentation

## 2016-05-26 DIAGNOSIS — E7800 Pure hypercholesterolemia, unspecified: Secondary | ICD-10-CM | POA: Insufficient documentation

## 2016-05-26 DIAGNOSIS — I10 Essential (primary) hypertension: Secondary | ICD-10-CM | POA: Insufficient documentation

## 2016-05-26 HISTORY — DX: Motion sickness, initial encounter: T75.3XXA

## 2016-05-26 HISTORY — PX: BROW LIFT: SHX178

## 2016-05-26 HISTORY — PX: PTOSIS REPAIR: SHX6568

## 2016-05-26 LAB — GLUCOSE, CAPILLARY
GLUCOSE-CAPILLARY: 218 mg/dL — AB (ref 65–99)
Glucose-Capillary: 250 mg/dL — ABNORMAL HIGH (ref 65–99)

## 2016-05-26 SURGERY — BLEPHAROPLASTY
Anesthesia: Monitor Anesthesia Care | Site: Eye | Laterality: Bilateral | Wound class: Clean

## 2016-05-26 MED ORDER — MIDAZOLAM HCL 2 MG/2ML IJ SOLN
INTRAMUSCULAR | Status: DC | PRN
  Administered 2016-05-26: 1 mg via INTRAVENOUS
  Administered 2016-05-26 (×2): 0.5 mg via INTRAVENOUS

## 2016-05-26 MED ORDER — TETRACAINE HCL 0.5 % OP SOLN
OPHTHALMIC | Status: DC | PRN
Start: 2016-05-26 — End: 2016-05-26
  Administered 2016-05-26: 1 [drp] via OPHTHALMIC

## 2016-05-26 MED ORDER — BSS IO SOLN
INTRAOCULAR | Status: DC | PRN
  Administered 2016-05-26: 4 mL

## 2016-05-26 MED ORDER — LABETALOL HCL 5 MG/ML IV SOLN
INTRAVENOUS | Status: DC | PRN
  Administered 2016-05-26: 10 mg via INTRAVENOUS

## 2016-05-26 MED ORDER — ERYTHROMYCIN 5 MG/GM OP OINT: TOPICAL_OINTMENT | OPHTHALMIC | 3 refills | Status: DC

## 2016-05-26 MED ORDER — ONDANSETRON HCL 4 MG/2ML IJ SOLN: 4.0000 mg | Freq: Once | INTRAMUSCULAR | Status: DC | PRN

## 2016-05-26 MED ORDER — EPINEPHRINE HCL (NASAL) 0.1 % NA SOLN
NASAL | Status: DC | PRN
  Administered 2016-05-26: 1 [drp] via TOPICAL

## 2016-05-26 MED ORDER — ACETAMINOPHEN 160 MG/5ML PO SOLN: 325.0000 mg | ORAL | Status: DC | PRN

## 2016-05-26 MED ORDER — ONDANSETRON HCL 4 MG/2ML IJ SOLN
INTRAMUSCULAR | Status: DC | PRN
  Administered 2016-05-26: 4 mg via INTRAVENOUS

## 2016-05-26 MED ORDER — ERYTHROMYCIN 5 MG/GM OP OINT
TOPICAL_OINTMENT | OPHTHALMIC | Status: DC | PRN
  Administered 2016-05-26: 1 via OPHTHALMIC

## 2016-05-26 MED ORDER — LIDOCAINE-EPINEPHRINE 2 %-1:100000 IJ SOLN
INTRAMUSCULAR | Status: DC | PRN
  Administered 2016-05-26 (×2): 3 mL via OPHTHALMIC

## 2016-05-26 MED ORDER — ACETAMINOPHEN 325 MG PO TABS: 325.0000 mg | ORAL_TABLET | ORAL | Status: DC | PRN

## 2016-05-26 MED ORDER — ALFENTANIL 500 MCG/ML IJ INJ
INJECTION | INTRAVENOUS | Status: DC | PRN
  Administered 2016-05-26: 600 ug via INTRAVENOUS
  Administered 2016-05-26 (×2): 200 ug via INTRAVENOUS

## 2016-05-26 MED ORDER — LACTATED RINGERS IV SOLN
INTRAVENOUS | Status: DC
  Administered 2016-05-26: 08:00:00 via INTRAVENOUS

## 2016-05-26 MED ORDER — OXYCODONE-ACETAMINOPHEN 5-325 MG PO TABS
1.0000 | ORAL_TABLET | ORAL | 0 refills | Status: DC | PRN
Start: 2016-05-26 — End: 2016-10-29

## 2016-05-26 SURGICAL SUPPLY — 36 items
APPLICATOR COTTON TIP WD 3 STR (MISCELLANEOUS) ×6 IMPLANT
BLADE SURG 15 STRL LF DISP TIS (BLADE) ×1 IMPLANT
BLADE SURG 15 STRL SS (BLADE) ×2
CORD BIP STRL DISP 12FT (MISCELLANEOUS) ×3 IMPLANT
DRAPE HEAD BAR (DRAPES) ×3 IMPLANT
GAUZE SPONGE 4X4 12PLY STRL (GAUZE/BANDAGES/DRESSINGS) ×3 IMPLANT
GAUZE SPONGE NON-WVN 2X2 STRL (MISCELLANEOUS) ×10 IMPLANT
GLOVE SURG LX 7.0 MICRO (GLOVE) ×4
GLOVE SURG LX STRL 7.0 MICRO (GLOVE) ×2 IMPLANT
MARKER SKIN XFINE TIP W/RULER (MISCELLANEOUS) ×3 IMPLANT
NEEDLE FILTER BLUNT 18X 1/2SAF (NEEDLE) ×2
NEEDLE FILTER BLUNT 18X1 1/2 (NEEDLE) ×1 IMPLANT
NEEDLE HYPO 30X.5 LL (NEEDLE) ×6 IMPLANT
PACK DRAPE NASAL/ENT (PACKS) ×3 IMPLANT
SOL PREP PVP 2OZ (MISCELLANEOUS) ×3
SOLUTION PREP PVP 2OZ (MISCELLANEOUS) ×1 IMPLANT
SPONGE VERSALON 2X2 STRL (MISCELLANEOUS) ×20
SUT CHROMIC 4-0 (SUTURE)
SUT CHROMIC 4-0 M2 12X2 ARM (SUTURE)
SUT CHROMIC 5 0 P 3 (SUTURE) IMPLANT
SUT ETHILON 4 0 CL P 3 (SUTURE) IMPLANT
SUT MERSILENE 4-0 S-2 (SUTURE) IMPLANT
SUT PDS AB 4-0 P3 18 (SUTURE) IMPLANT
SUT PLAIN GUT (SUTURE) ×3 IMPLANT
SUT PROLENE 5 0 P 3 (SUTURE) IMPLANT
SUT PROLENE 6 0 P 1 18 (SUTURE) ×6 IMPLANT
SUT SILK 4 0 G 3 (SUTURE) IMPLANT
SUT VIC AB 5-0 P-3 18X BRD (SUTURE) IMPLANT
SUT VIC AB 5-0 P3 18 (SUTURE)
SUT VICRYL 6-0  S14 CTD (SUTURE)
SUT VICRYL 6-0 S14 CTD (SUTURE) IMPLANT
SUT VICRYL 7 0 TG140 8 (SUTURE) IMPLANT
SUTURE CHRMC 4-0 M2 12X2 ARM (SUTURE) IMPLANT
SYR 3ML LL SCALE MARK (SYRINGE) ×3 IMPLANT
SYRINGE 10CC LL (SYRINGE) ×3 IMPLANT
WATER STERILE IRR 250ML POUR (IV SOLUTION) ×3 IMPLANT

## 2016-05-26 NOTE — Transfer of Care (Signed)
Immediate Anesthesia Transfer of Care Note  Patient: Miguel White  Procedure(s) Performed: Procedure(s) with comments: BLEPHAROPLASTY UPPER EYELID W/EXCESS SKIN (Bilateral) PTOSIS REPAIR RESECT EX (Bilateral) - Diabetic - insulin and oral meds sleep apnea  Patient Location: PACU  Anesthesia Type: MAC  Level of Consciousness: awake, alert  and patient cooperative  Airway and Oxygen Therapy: Patient Spontanous Breathing and Patient connected to supplemental oxygen  Post-op Assessment: Post-op Vital signs reviewed, Patient's Cardiovascular Status Stable, Respiratory Function Stable, Patent Airway and No signs of Nausea or vomiting  Post-op Vital Signs: Reviewed and stable  Complications: No apparent anesthesia complications

## 2016-05-26 NOTE — H&P (Signed)
See the history and physical completed at [facility] on 05/04/16 and scanned into the chart.

## 2016-05-26 NOTE — Op Note (Signed)
Preoperative Diagnosis:  1. Visually significant blepharoptosis both Upper Eyelid(s) 2. Visually significant dermatochalasis both Upper Eyelid(s)  Postoperative Diagnosis:  Same.  Procedure(s) Performed:   1. Blepharoptosis repair with levator aponeurosis advancement bilateral Upper Eyelid(s) 2. Upper eyelid blepharoplasty with excess skin excision  bilateral Upper Eyelid(s)  Surgeon: Philis Pique. Vickki Muff, M.D.  Assistants: none  Anesthesia: MAC  Specimens: None.  Estimated Blood Loss: Minimal.  Complications: None.  Operative Findings: None Dictated  Procedure:   Allergies were reviewed and the patient Pilocarpine hcl.   After the risks, benefits, complications and alternatives were discussed with the patient, appropriate informed consent was obtained.  While seated in an upright position and looking in primary gaze, the mid pupillary line was marked on the upper eyelid margins bilaterally. The patient was then brought to the operating suite and reclined supine.  Timeout was conducted and the patient was sedated.  Local anesthetic consisting of a 50-50 mixture of 2% lidocaine with epinephrine and 0.75% bupivacaine with added Hylenex was injected subcutaneously to both upper eyelid(s). After adequate local was instilled, the patient was prepped and draped in the usual sterile fashion for eyelid surgery.   Attention was turned to the upper eyelids. A 20m upper eyelid crease incision line was marked with calipers on both upper eyelid(s).  A pinch test was used to estimate the amount of excess skin to remove and this was marked in standard blepharoplasty style fashion. Attention was turned to the  right upper eyelid. A #15 blade was used to open the premarked incision line. A skin and muscle flap was excised and hemostasis was obtained with bipolar cautery. He was noticed to have brisk bleeding. Additional medications were given to control his blood pressure and a patty soaked in 01-998  epinephrine was placed on the surgical bed before turning to the opposite eye  [place]cott scissors were then used to transect through orbicularis down to the tarsal plate. Epitarsus was dissected to create a smooth surface to suture to. Dissection was then carried superiorly in the plane between orbicularis and orbital septum. Once the preaponeurotic fat pocket was identified, the orbital septum was opened. This revealed the levator and its aponeurosis.    Attention was then turned to the opposite eyelid where the same procedure was performed in the same manner. Hemostasis was obtained with bipolar cautery throughout.   3 interrupted 6-0 Prolene sutures were then passed partial thickness through the tarsal plates of both upper eyelid(s). These sutures were placed in line with the mid pupillary, medial limbal, and lateral limbal lines. The sutures were fixed to the levator aponeurosis and adjusted until a nice lid height and contour were achieved. Once nice symmetry was achieved, the skin incisions were closed with a running 6-0 fast absorbing plain suture. The patient tolerated the procedure well.  Erythromycin ophthalmic ointment was applied to the incision site(s) followed by ice packs. The patient was taken to the recovery area where he recovered without difficulty.  Post-Op Plan/Instructions:  The patient was instructed to use ice packs frequently for the next 48 hours. He was instructed to use erythromycin ophthalmic ointment on his incisions 4 times a day for the next 12 to 14 days. Hewas given a prescription for Percocet for pain control should Tylenol not be effective. [facility] [place] [facility] or sooner as needed for problems.  Abegail Kloeppel M. FVickki Muff M.D. Attending,Ophthalmology

## 2016-05-26 NOTE — Interval H&P Note (Signed)
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 After consideration of risks, benefits and other options for treatment, the patient has consented to  Procedure(s) with comments: BLEPHAROPLASTY UPPER EYELID W/EXCESS SKIN (Bilateral) PTOSIS REPAIR RESECT EX (Bilateral) - Diabetic - insulin and oral meds sleep apnea as a surgical intervention .  The patient's history has been reviewed, patient examined, no change in status, stable for surgery.  I have reviewed the patient's chart and labs.  Questions were answered to the patient's satisfaction.     Vickki Muff, Marquel Spoto M

## 2016-05-26 NOTE — Anesthesia Preprocedure Evaluation (Signed)
Anesthesia Evaluation  Patient identified by MRN, date of birth, ID band Patient awake    Reviewed: Allergy & Precautions, H&P , NPO status , Patient's Chart, lab work & pertinent test results, reviewed documented beta blocker date and time   Airway Mallampati: II  TM Distance: >3 FB Neck ROM: full    Dental no notable dental hx.    Pulmonary sleep apnea , former smoker,    Pulmonary exam normal breath sounds clear to auscultation       Cardiovascular Exercise Tolerance: Good hypertension,  Rhythm:regular Rate:Normal     Neuro/Psych negative neurological ROS  negative psych ROS   GI/Hepatic negative GI ROS, Neg liver ROS,   Endo/Other  negative endocrine ROSdiabetes  Renal/GU negative Renal ROS  negative genitourinary   Musculoskeletal   Abdominal   Peds  Hematology negative hematology ROS (+)   Anesthesia Other Findings   Reproductive/Obstetrics negative OB ROS                             Anesthesia Physical Anesthesia Plan  ASA: II  Anesthesia Plan: MAC   Post-op Pain Management:    Induction:   Airway Management Planned:   Additional Equipment:   Intra-op Plan:   Post-operative Plan:   Informed Consent: I have reviewed the patients History and Physical, chart, labs and discussed the procedure including the risks, benefits and alternatives for the proposed anesthesia with the patient or authorized representative who has indicated his/her understanding and acceptance.   Dental Advisory Given  Plan Discussed with: CRNA  Anesthesia Plan Comments:         Anesthesia Quick Evaluation

## 2016-05-26 NOTE — Anesthesia Postprocedure Evaluation (Signed)
Anesthesia Post Note  Patient: Miguel White  Procedure(s) Performed: Procedure(s) (LRB): BLEPHAROPLASTY UPPER EYELID W/EXCESS SKIN (Bilateral) PTOSIS REPAIR RESECT EX (Bilateral)  Patient location during evaluation: PACU Anesthesia Type: MAC Level of consciousness: awake and alert Pain management: pain level controlled Vital Signs Assessment: post-procedure vital signs reviewed and stable Respiratory status: spontaneous breathing, nonlabored ventilation, respiratory function stable and patient connected to nasal cannula oxygen Cardiovascular status: stable and blood pressure returned to baseline Anesthetic complications: no    Alisa Graff

## 2016-05-26 NOTE — Anesthesia Procedure Notes (Signed)
Procedure Name: MAC Performed by: Timi Reeser Pre-anesthesia Checklist: Patient identified, Emergency Drugs available, Suction available, Patient being monitored and Timeout performed Patient Re-evaluated:Patient Re-evaluated prior to inductionOxygen Delivery Method: Nasal cannula       

## 2016-05-27 ENCOUNTER — Encounter: Payer: Self-pay | Admitting: Ophthalmology

## 2016-09-03 NOTE — Addendum Note (Signed)
Addendum  created 09/03/16 3612 by Roberts Gaudy, MD   Sign clinical note

## 2016-09-25 ENCOUNTER — Other Ambulatory Visit: Payer: Self-pay | Admitting: Otolaryngology

## 2016-09-25 ENCOUNTER — Other Ambulatory Visit: Payer: Self-pay | Admitting: Neurosurgery

## 2016-09-25 DIAGNOSIS — M5416 Radiculopathy, lumbar region: Secondary | ICD-10-CM

## 2016-10-02 ENCOUNTER — Ambulatory Visit
Admission: RE | Admit: 2016-10-02 | Discharge: 2016-10-02 | Disposition: A | Discharge status: Home / Self Care | Payer: Medicare Other | Source: Ambulatory Visit | Attending: Otolaryngology | Admitting: Otolaryngology

## 2016-10-02 DIAGNOSIS — M48061 Spinal stenosis, lumbar region without neurogenic claudication: Secondary | ICD-10-CM | POA: Insufficient documentation

## 2016-10-02 DIAGNOSIS — M5416 Radiculopathy, lumbar region: Secondary | ICD-10-CM | POA: Insufficient documentation

## 2016-10-02 DIAGNOSIS — M4326 Fusion of spine, lumbar region: Secondary | ICD-10-CM | POA: Insufficient documentation

## 2016-10-02 DIAGNOSIS — R937 Abnormal findings on diagnostic imaging of other parts of musculoskeletal system: Secondary | ICD-10-CM | POA: Insufficient documentation

## 2016-10-02 MED ORDER — GADOBENATE DIMEGLUMINE 529 MG/ML IV SOLN
20.0000 mL | Freq: Once | INTRAVENOUS | Status: AC | PRN
  Administered 2016-10-02: 20 mL via INTRAVENOUS

## 2016-10-06 ENCOUNTER — Other Ambulatory Visit: Payer: Self-pay | Admitting: Neurosurgery

## 2016-10-21 ENCOUNTER — Other Ambulatory Visit (HOSPITAL_COMMUNITY): Payer: Self-pay | Admitting: *Deleted

## 2016-10-21 NOTE — Pre-Procedure Instructions (Signed)
Miguel White  10/21/2016    Your procedure is scheduled on Wednesday, October 28, 2016 at 3:15 PM.   Report to [facility] Entrance "A" Admitting Office at 1:15 PM.   Call this number if you have problems the morning of surgery: [phone]   Questions prior to day of surgery, please call [phone] between 8 & 4 PM.   Remember:  Do not eat food or drink liquids after midnight Tuesday, 10/27/16.  Take these medicines the morning of surgery with A SIP OF WATER: Cetirizine (Zyrtec), Citalopram (Celexa), Gabapentin (Neurontin), Tamsulosin (Flomax), Flonase - if needed  Stop Aspirin, NSAIDS (Ibuprofen, Aleve, etc) and Multivitamins 5 days prior to surgery.  Do not take your diabetic pills (Januvia, Glimepiride or Metformin) day of surgery.  The evening before surgery, take 35 units of Lantus Insulin.   How to Manage Your Diabetes Before Surgery   Why is it important to control my blood sugar before and after surgery?   Improving blood sugar levels before and after surgery helps healing and can limit problems.  A way of improving blood sugar control is eating a healthy diet by:  - Eating less sugar and carbohydrates  - Increasing activity/exercise  - Talk with your doctor about reaching your blood sugar goals  High blood sugars (greater than 180 mg/dL) can raise your risk of infections and slow down your recovery so you will need to focus on controlling your diabetes during the weeks before surgery.  Make sure that the doctor who takes care of your diabetes knows about your planned surgery including the date and location.  How do I manage my blood sugars before surgery?   Check your blood sugar at least 4 times a day, 2 days before surgery to make sure that they are not too high or low.  Check your blood sugar the morning of your surgery when you wake up and every 2 hours until you get to the Short-Stay unit.  Treat a low blood sugar (less than 70 mg/dL)  with 1/2 cup of clear juice (cranberry or apple), 4 glucose tablets, OR glucose gel.  Recheck blood sugar in 15 minutes after treatment (to make sure it is greater than 70 mg/dL).  If blood sugar is not greater than 70 mg/dL on re-check, call [phone] for further instructions.   Report your blood sugar to the Short-Stay nurse when you get to Short-Stay.  References:  [facility], 2007 "How to Manage your Diabetes Before and After Surgery".   Do not wear jewelry, make-up or nail polish.  Do not wear lotions, powders, cologne or deodorant.  Men may shave face and neck.  Do not bring valuables to the hospital.  [facility] is not responsible for any belongings or valuables.  Contacts, dentures or bridgework may not be worn into surgery.  Leave your suitcase in the car.  After surgery it may be brought to your room.  For patients admitted to the hospital, discharge time will be determined by your treatment team.  [facility] - Preparing for Surgery  Before surgery, you can play an important role.  Because skin is not sterile, your skin needs to be as free of germs as possible.  You can reduce the number of germs on you skin by washing with CHG (chlorahexidine gluconate) soap before surgery.  CHG is an antiseptic cleaner which kills germs and bonds with the skin to continue killing germs even after washing.  Please DO  NOT use if you have an allergy to CHG or antibacterial soaps.  If your skin becomes reddened/irritated stop using the CHG and inform your nurse when you arrive at Short Stay.  Do not shave (including legs and underarms) for at least 48 hours prior to the first CHG shower.  You may shave your face.  Please follow these instructions carefully:   1.  Shower with CHG Soap the night before surgery and the                    morning of Surgery.  2.  If you choose to wash your hair, wash your hair first as usual with your       normal shampoo.  3.   After you shampoo, rinse your hair and body thoroughly to remove the shampoo.  4.  Use CHG as you would any other liquid soap.  You can apply chg directly       to the skin and wash gently with scrungie or a clean washcloth.  5.  Apply the CHG Soap to your body ONLY FROM THE NECK DOWN.        Do not use on open wounds or open sores.  Avoid contact with your eyes, ears, mouth and genitals (private parts).  Wash genitals (private parts) with your normal soap.  6.  Wash thoroughly, paying special attention to the area where your surgery        will be performed.  7.  Thoroughly rinse your body with warm water from the neck down.  8.  DO NOT shower/wash with your normal soap after using and rinsing off       the CHG Soap.  9.  Pat yourself dry with a clean towel.            10.  Wear clean pajamas.            11.  Place clean sheets on your bed the night of your first shower and do not        sleep with pets.  Day of Surgery  Do not apply any lotions/deodorants the morning of surgery.  Please wear clean clothes to the hospital.   Please read over the fact sheets that you were given.

## 2016-10-22 ENCOUNTER — Encounter (HOSPITAL_COMMUNITY)
Admission: RE | Admit: 2016-10-22 | Discharge: 2016-10-22 | Disposition: A | Discharge status: Home / Self Care | Payer: Medicare Other | Source: Ambulatory Visit | Attending: Neurosurgery | Admitting: Neurosurgery

## 2016-10-22 ENCOUNTER — Encounter (HOSPITAL_COMMUNITY): Payer: Self-pay

## 2016-10-22 DIAGNOSIS — Z01812 Encounter for preprocedural laboratory examination: Secondary | ICD-10-CM | POA: Insufficient documentation

## 2016-10-22 DIAGNOSIS — E119 Type 2 diabetes mellitus without complications: Secondary | ICD-10-CM | POA: Insufficient documentation

## 2016-10-22 HISTORY — DX: Personal history of other mental and behavioral disorders: Z86.59

## 2016-10-22 LAB — CBC
HCT: 40.7 % (ref 39.0–52.0)
Hemoglobin: 14.2 g/dL (ref 13.0–17.0)
MCH: 32.1 pg (ref 26.0–34.0)
MCHC: 34.9 g/dL (ref 30.0–36.0)
MCV: 92.1 fL (ref 78.0–100.0)
Platelets: 147 10*3/uL — ABNORMAL LOW (ref 150–400)
RBC: 4.42 MIL/uL (ref 4.22–5.81)
RDW: 13.3 % (ref 11.5–15.5)
WBC: 6 10*3/uL (ref 4.0–10.5)

## 2016-10-22 LAB — GLUCOSE, CAPILLARY: GLUCOSE-CAPILLARY: 149 mg/dL — AB (ref 65–99)

## 2016-10-22 LAB — BASIC METABOLIC PANEL
ANION GAP: 9 (ref 5–15)
BUN: 14 mg/dL (ref 6–20)
CALCIUM: 9.4 mg/dL (ref 8.9–10.3)
CO2: 25 mmol/L (ref 22–32)
Chloride: 102 mmol/L (ref 101–111)
Creatinine, Ser: 0.63 mg/dL (ref 0.61–1.24)
GFR calc Af Amer: >60 mL/min (ref >60)
GLUCOSE: 156 mg/dL — AB (ref 65–99)
Potassium: 3.6 mmol/L (ref 3.5–5.1)
SODIUM: 136 mmol/L (ref 135–145)

## 2016-10-22 LAB — HEMOGLOBIN A1C
HEMOGLOBIN A1C: 5.4 % (ref 4.8–5.6)
MEAN PLASMA GLUCOSE: 108.28 mg/dL

## 2016-10-22 LAB — SURGICAL PCR SCREEN
MRSA, PCR: NEGATIVE
STAPHYLOCOCCUS AUREUS: NEGATIVE

## 2016-10-22 NOTE — Progress Notes (Signed)
Pt denies cardiac history, chest pain or sob. Pt is a type 2 diabetic. Last A1C was 5.5 on 08/25/16. Pt states his fasting blood sugar is usually between 110-140. Today's CBG was 149 and pt had eaten breakfast.

## 2016-10-28 ENCOUNTER — Inpatient Hospital Stay (HOSPITAL_COMMUNITY)
Admission: RE | Admit: 2016-10-28 | Discharge: 2016-10-29 | DRG: 516 | Disposition: A | Discharge status: Home / Self Care | Payer: Medicare Other | Source: Ambulatory Visit | Attending: Neurosurgery | Admitting: Neurosurgery

## 2016-10-28 ENCOUNTER — Encounter (HOSPITAL_COMMUNITY): Payer: Self-pay | Admitting: *Deleted

## 2016-10-28 ENCOUNTER — Inpatient Hospital Stay (HOSPITAL_COMMUNITY): Payer: Medicare Other | Admitting: Anesthesiology

## 2016-10-28 ENCOUNTER — Encounter (HOSPITAL_COMMUNITY)
Admission: RE | Disposition: A | Discharge status: Home / Self Care | Payer: Self-pay | Source: Ambulatory Visit | Attending: Neurosurgery

## 2016-10-28 DIAGNOSIS — M48061 Spinal stenosis, lumbar region without neurogenic claudication: Secondary | ICD-10-CM | POA: Diagnosis present

## 2016-10-28 DIAGNOSIS — M5416 Radiculopathy, lumbar region: Secondary | ICD-10-CM | POA: Diagnosis present

## 2016-10-28 DIAGNOSIS — Z87891 Personal history of nicotine dependence: Secondary | ICD-10-CM

## 2016-10-28 DIAGNOSIS — E1142 Type 2 diabetes mellitus with diabetic polyneuropathy: Secondary | ICD-10-CM | POA: Diagnosis present

## 2016-10-28 DIAGNOSIS — E785 Hyperlipidemia, unspecified: Secondary | ICD-10-CM | POA: Diagnosis present

## 2016-10-28 DIAGNOSIS — Z794 Long term (current) use of insulin: Secondary | ICD-10-CM | POA: Diagnosis not present

## 2016-10-28 DIAGNOSIS — I10 Essential (primary) hypertension: Secondary | ICD-10-CM | POA: Diagnosis present

## 2016-10-28 DIAGNOSIS — Z7982 Long term (current) use of aspirin: Secondary | ICD-10-CM

## 2016-10-28 DIAGNOSIS — G4733 Obstructive sleep apnea (adult) (pediatric): Secondary | ICD-10-CM | POA: Diagnosis present

## 2016-10-28 DIAGNOSIS — Z800 Family history of malignant neoplasm of digestive organs: Secondary | ICD-10-CM

## 2016-10-28 DIAGNOSIS — Z8619 Personal history of other infectious and parasitic diseases: Secondary | ICD-10-CM

## 2016-10-28 DIAGNOSIS — Z6841 Body Mass Index (BMI) 40.0 and over, adult: Secondary | ICD-10-CM | POA: Diagnosis not present

## 2016-10-28 DIAGNOSIS — N400 Enlarged prostate without lower urinary tract symptoms: Secondary | ICD-10-CM | POA: Diagnosis present

## 2016-10-28 DIAGNOSIS — F329 Major depressive disorder, single episode, unspecified: Secondary | ICD-10-CM | POA: Diagnosis present

## 2016-10-28 DIAGNOSIS — H409 Unspecified glaucoma: Secondary | ICD-10-CM | POA: Diagnosis present

## 2016-10-28 DIAGNOSIS — Z981 Arthrodesis status: Secondary | ICD-10-CM | POA: Diagnosis not present

## 2016-10-28 DIAGNOSIS — M48062 Spinal stenosis, lumbar region with neurogenic claudication: Principal | ICD-10-CM | POA: Diagnosis present

## 2016-10-28 DIAGNOSIS — G9619 Other disorders of meninges, not elsewhere classified: Secondary | ICD-10-CM | POA: Diagnosis present

## 2016-10-28 HISTORY — PX: LUMBAR LAMINECTOMY/DECOMPRESSION MICRODISCECTOMY: SHX5026

## 2016-10-28 LAB — GLUCOSE, CAPILLARY
GLUCOSE-CAPILLARY: 152 mg/dL — AB (ref 65–99)
Glucose-Capillary: 157 mg/dL — ABNORMAL HIGH (ref 65–99)
Glucose-Capillary: 169 mg/dL — ABNORMAL HIGH (ref 65–99)
Glucose-Capillary: 204 mg/dL — ABNORMAL HIGH (ref 65–99)

## 2016-10-28 LAB — HEMOGLOBIN A1C
HEMOGLOBIN A1C: 5.3 % (ref 4.8–5.6)
MEAN PLASMA GLUCOSE: 105.41 mg/dL

## 2016-10-28 SURGERY — LUMBAR LAMINECTOMY/DECOMPRESSION MICRODISCECTOMY 1 LEVEL
Anesthesia: General | Laterality: Bilateral

## 2016-10-28 MED ORDER — INSULIN ASPART 100 UNIT/ML ~~LOC~~ SOLN
0.0000 [IU] | Freq: Three times a day (TID) | SUBCUTANEOUS | Status: DC
Start: 2016-10-29 — End: 2016-10-29

## 2016-10-28 MED ORDER — LORATADINE 10 MG PO TABS
10.0000 mg | ORAL_TABLET | Freq: Every day | ORAL | Status: DC
  Administered 2016-10-28: 10 mg via ORAL
  Filled 2016-10-28: qty 1

## 2016-10-28 MED ORDER — MIDAZOLAM HCL 5 MG/5ML IJ SOLN
INTRAMUSCULAR | Status: DC | PRN
  Administered 2016-10-28: 2 mg via INTRAVENOUS

## 2016-10-28 MED ORDER — TAMSULOSIN HCL 0.4 MG PO CAPS
0.4000 mg | ORAL_CAPSULE | Freq: Every day | ORAL | Status: DC
  Administered 2016-10-28: 0.4 mg via ORAL
  Filled 2016-10-28: qty 1

## 2016-10-28 MED ORDER — CEFAZOLIN SODIUM-DEXTROSE 2-4 GM/100ML-% IV SOLN
INTRAVENOUS | Status: AC
  Filled 2016-10-28: qty 100

## 2016-10-28 MED ORDER — HYDROCHLOROTHIAZIDE 25 MG PO TABS
25.0000 mg | ORAL_TABLET | Freq: Every evening | ORAL | Status: DC
  Administered 2016-10-28: 25 mg via ORAL
  Filled 2016-10-28: qty 1

## 2016-10-28 MED ORDER — INSULIN ASPART 100 UNIT/ML ~~LOC~~ SOLN
0.0000 [IU] | SUBCUTANEOUS | Status: DC
  Administered 2016-10-28: 4 [IU] via SUBCUTANEOUS

## 2016-10-28 MED ORDER — HEMOSTATIC AGENTS (NO CHARGE) OPTIME
TOPICAL | Status: DC | PRN
  Administered 2016-10-28: 1 via TOPICAL

## 2016-10-28 MED ORDER — EZETIMIBE-SIMVASTATIN 10-20 MG PO TABS
1.0000 | ORAL_TABLET | Freq: Every day | ORAL | Status: DC
Start: 2016-10-28 — End: 2016-10-29
  Administered 2016-10-28: 1 via ORAL
  Filled 2016-10-28: qty 1

## 2016-10-28 MED ORDER — LATANOPROST 0.005 % OP SOLN
1.0000 [drp] | Freq: Every day | OPHTHALMIC | Status: DC
  Administered 2016-10-28: 1 [drp] via OPHTHALMIC
  Filled 2016-10-28: qty 2.5

## 2016-10-28 MED ORDER — AMLODIPINE BESYLATE 10 MG PO TABS
10.0000 mg | ORAL_TABLET | Freq: Every day | ORAL | Status: DC
  Filled 2016-10-28: qty 1

## 2016-10-28 MED ORDER — ONDANSETRON HCL 4 MG/2ML IJ SOLN
INTRAMUSCULAR | Status: AC
  Administered 2016-10-28: 4 mg via INTRAVENOUS
  Filled 2016-10-28: qty 2

## 2016-10-28 MED ORDER — HYDROMORPHONE HCL 1 MG/ML IJ SOLN
INTRAMUSCULAR | Status: AC
  Filled 2016-10-28: qty 1

## 2016-10-28 MED ORDER — SUGAMMADEX SODIUM 200 MG/2ML IV SOLN
INTRAVENOUS | Status: DC | PRN
  Administered 2016-10-28: 218.6 mg via INTRAVENOUS

## 2016-10-28 MED ORDER — VANCOMYCIN HCL 1000 MG IV SOLR
INTRAVENOUS | Status: AC
  Filled 2016-10-28: qty 1000

## 2016-10-28 MED ORDER — GABAPENTIN 300 MG PO CAPS
600.0000 mg | ORAL_CAPSULE | Freq: Two times a day (BID) | ORAL | Status: DC
  Administered 2016-10-28: 600 mg via ORAL
  Filled 2016-10-28 (×2): qty 2

## 2016-10-28 MED ORDER — PROPOFOL 10 MG/ML IV BOLUS
INTRAVENOUS | Status: DC | PRN
  Administered 2016-10-28: 200 mg via INTRAVENOUS

## 2016-10-28 MED ORDER — LACTATED RINGERS IV SOLN
INTRAVENOUS | Status: DC | PRN
  Administered 2016-10-28 (×2): via INTRAVENOUS

## 2016-10-28 MED ORDER — ACETAMINOPHEN 650 MG RE SUPP: 650.0000 mg | RECTAL | Status: DC | PRN

## 2016-10-28 MED ORDER — LINAGLIPTIN 5 MG PO TABS
5.0000 mg | ORAL_TABLET | Freq: Every day | ORAL | Status: DC
  Administered 2016-10-28: 5 mg via ORAL
  Filled 2016-10-28 (×2): qty 1

## 2016-10-28 MED ORDER — FLUTICASONE PROPIONATE 50 MCG/ACT NA SUSP
2.0000 | Freq: Every day | NASAL | Status: DC | PRN
  Filled 2016-10-28: qty 16

## 2016-10-28 MED ORDER — THROMBIN 5000 UNITS EX SOLR
OROMUCOSAL | Status: DC | PRN
  Administered 2016-10-28 (×2): via TOPICAL

## 2016-10-28 MED ORDER — FENTANYL CITRATE (PF) 100 MCG/2ML IJ SOLN
INTRAMUSCULAR | Status: DC | PRN
Start: 2016-10-28 — End: 2016-10-28
  Administered 2016-10-28: 100 ug via INTRAVENOUS
  Administered 2016-10-28: 50 ug via INTRAVENOUS

## 2016-10-28 MED ORDER — PHENYLEPHRINE 40 MCG/ML (10ML) SYRINGE FOR IV PUSH (FOR BLOOD PRESSURE SUPPORT)
PREFILLED_SYRINGE | INTRAVENOUS | Status: AC
  Filled 2016-10-28: qty 20

## 2016-10-28 MED ORDER — SODIUM CHLORIDE 0.9 % IR SOLN
Status: DC | PRN
  Administered 2016-10-28: 12:00:00

## 2016-10-28 MED ORDER — SUCCINYLCHOLINE CHLORIDE 20 MG/ML IJ SOLN
INTRAMUSCULAR | Status: DC | PRN
  Administered 2016-10-28: 100 mg via INTRAVENOUS

## 2016-10-28 MED ORDER — CEFAZOLIN SODIUM-DEXTROSE 2-4 GM/100ML-% IV SOLN
2.0000 g | INTRAVENOUS | Status: AC
  Administered 2016-10-28 (×2): 2 g via INTRAVENOUS

## 2016-10-28 MED ORDER — CYCLOBENZAPRINE HCL 10 MG PO TABS
10.0000 mg | ORAL_TABLET | Freq: Three times a day (TID) | ORAL | Status: DC | PRN
  Administered 2016-10-28 – 2016-10-29 (×2): 10 mg via ORAL
  Filled 2016-10-28 (×2): qty 1

## 2016-10-28 MED ORDER — BACITRACIN ZINC 500 UNIT/GM EX OINT
TOPICAL_OINTMENT | CUTANEOUS | Status: DC | PRN
  Administered 2016-10-28: 1 via TOPICAL

## 2016-10-28 MED ORDER — METFORMIN HCL 500 MG PO TABS
1000.0000 mg | ORAL_TABLET | Freq: Two times a day (BID) | ORAL | Status: DC
  Administered 2016-10-28: 1000 mg via ORAL
  Filled 2016-10-28: qty 2

## 2016-10-28 MED ORDER — CITALOPRAM HYDROBROMIDE 20 MG PO TABS
20.0000 mg | ORAL_TABLET | Freq: Every day | ORAL | Status: DC
  Administered 2016-10-28: 20 mg via ORAL
  Filled 2016-10-28 (×2): qty 1

## 2016-10-28 MED ORDER — LACTATED RINGERS IV SOLN
INTRAVENOUS | Status: DC
  Administered 2016-10-28: 10:00:00 via INTRAVENOUS

## 2016-10-28 MED ORDER — TIMOLOL MALEATE 0.5 % OP SOLN
1.0000 [drp] | Freq: Every evening | OPHTHALMIC | Status: DC
  Administered 2016-10-28: 1 [drp] via OPHTHALMIC
  Filled 2016-10-28: qty 5

## 2016-10-28 MED ORDER — PHENYLEPHRINE HCL 10 MG/ML IJ SOLN
INTRAMUSCULAR | Status: DC | PRN
  Administered 2016-10-28 (×4): 80 ug via INTRAVENOUS

## 2016-10-28 MED ORDER — SODIUM CHLORIDE 0.9 % IV SOLN: 250.0000 mL | INTRAVENOUS | Status: DC

## 2016-10-28 MED ORDER — IBUPROFEN 200 MG PO TABS: 400.0000 mg | ORAL_TABLET | Freq: Four times a day (QID) | ORAL | Status: DC | PRN

## 2016-10-28 MED ORDER — LIDOCAINE 2% (20 MG/ML) 5 ML SYRINGE
INTRAMUSCULAR | Status: AC
  Filled 2016-10-28: qty 5

## 2016-10-28 MED ORDER — ROCURONIUM BROMIDE 50 MG/5ML IV SOLN
INTRAVENOUS | Status: AC
  Filled 2016-10-28: qty 2

## 2016-10-28 MED ORDER — OXYCODONE HCL 5 MG PO TABS
5.0000 mg | ORAL_TABLET | ORAL | Status: DC | PRN
  Administered 2016-10-28 – 2016-10-29 (×3): 10 mg via ORAL
  Filled 2016-10-28 (×3): qty 2

## 2016-10-28 MED ORDER — LIDOCAINE HCL (CARDIAC) 20 MG/ML IV SOLN
INTRAVENOUS | Status: DC | PRN
  Administered 2016-10-28: 50 mg via INTRAVENOUS

## 2016-10-28 MED ORDER — BACITRACIN ZINC 500 UNIT/GM EX OINT
TOPICAL_OINTMENT | CUTANEOUS | Status: AC
  Filled 2016-10-28: qty 28.35

## 2016-10-28 MED ORDER — ONDANSETRON HCL 4 MG/2ML IJ SOLN
INTRAMUSCULAR | Status: DC | PRN
Start: 2016-10-28 — End: 2016-10-28
  Administered 2016-10-28: 4 mg via INTRAVENOUS

## 2016-10-28 MED ORDER — HYDROMORPHONE HCL 1 MG/ML IJ SOLN
0.2500 mg | INTRAMUSCULAR | Status: DC | PRN
  Administered 2016-10-28: 0.5 mg via INTRAVENOUS

## 2016-10-28 MED ORDER — SUCCINYLCHOLINE CHLORIDE 200 MG/10ML IV SOSY
PREFILLED_SYRINGE | INTRAVENOUS | Status: AC
  Filled 2016-10-28: qty 10

## 2016-10-28 MED ORDER — CHLORHEXIDINE GLUCONATE CLOTH 2 % EX PADS: 6.0000 | MEDICATED_PAD | Freq: Once | CUTANEOUS | Status: DC

## 2016-10-28 MED ORDER — CEFAZOLIN SODIUM-DEXTROSE 2-4 GM/100ML-% IV SOLN
2.0000 g | Freq: Three times a day (TID) | INTRAVENOUS | Status: AC
  Administered 2016-10-28 – 2016-10-29 (×2): 2 g via INTRAVENOUS
  Filled 2016-10-28 (×2): qty 100

## 2016-10-28 MED ORDER — SODIUM CHLORIDE 0.9% FLUSH: 3.0000 mL | INTRAVENOUS | Status: DC | PRN

## 2016-10-28 MED ORDER — AMLODIPINE BESY-BENAZEPRIL HCL 10-20 MG PO CAPS: 1.0000 | ORAL_CAPSULE | Freq: Every day | ORAL | Status: DC

## 2016-10-28 MED ORDER — SUGAMMADEX SODIUM 200 MG/2ML IV SOLN
INTRAVENOUS | Status: AC
  Filled 2016-10-28: qty 4

## 2016-10-28 MED ORDER — BENAZEPRIL HCL 20 MG PO TABS
20.0000 mg | ORAL_TABLET | Freq: Every day | ORAL | Status: DC
  Filled 2016-10-28: qty 1

## 2016-10-28 MED ORDER — MENTHOL 3 MG MT LOZG: 1.0000 | LOZENGE | OROMUCOSAL | Status: DC | PRN

## 2016-10-28 MED ORDER — PROPOFOL 10 MG/ML IV BOLUS
INTRAVENOUS | Status: AC
  Filled 2016-10-28: qty 20

## 2016-10-28 MED ORDER — ONDANSETRON HCL 4 MG/2ML IJ SOLN
INTRAMUSCULAR | Status: AC
  Filled 2016-10-28: qty 2

## 2016-10-28 MED ORDER — INFLUENZA VAC SPLIT HIGH-DOSE 0.5 ML IM SUSY
0.5000 mL | PREFILLED_SYRINGE | INTRAMUSCULAR | Status: DC
  Filled 2016-10-28: qty 0.5

## 2016-10-28 MED ORDER — DOCUSATE SODIUM 100 MG PO CAPS
100.0000 mg | ORAL_CAPSULE | Freq: Two times a day (BID) | ORAL | Status: DC
  Administered 2016-10-28: 100 mg via ORAL
  Filled 2016-10-28: qty 1

## 2016-10-28 MED ORDER — CEFAZOLIN SODIUM-DEXTROSE 2-4 GM/100ML-% IV SOLN
2.0000 g | INTRAVENOUS | Status: DC
  Filled 2016-10-28: qty 100

## 2016-10-28 MED ORDER — THROMBIN 5000 UNITS EX SOLR
CUTANEOUS | Status: DC | PRN
  Administered 2016-10-28: 10000 [IU] via TOPICAL

## 2016-10-28 MED ORDER — PHENYLEPHRINE HCL 10 MG/ML IJ SOLN
INTRAVENOUS | Status: DC | PRN
  Administered 2016-10-28: 50 ug/min via INTRAVENOUS

## 2016-10-28 MED ORDER — BUPIVACAINE-EPINEPHRINE (PF) 0.5% -1:200000 IJ SOLN
INTRAMUSCULAR | Status: AC
  Filled 2016-10-28: qty 30

## 2016-10-28 MED ORDER — ADULT MULTIVITAMIN W/MINERALS CH
1.0000 | ORAL_TABLET | Freq: Every day | ORAL | Status: DC
  Administered 2016-10-28: 1 via ORAL
  Filled 2016-10-28: qty 1

## 2016-10-28 MED ORDER — ROCURONIUM BROMIDE 100 MG/10ML IV SOLN
INTRAVENOUS | Status: DC | PRN
  Administered 2016-10-28: 10 mg via INTRAVENOUS
  Administered 2016-10-28: 50 mg via INTRAVENOUS
  Administered 2016-10-28: 10 mg via INTRAVENOUS

## 2016-10-28 MED ORDER — SODIUM CHLORIDE 0.9% FLUSH: 3.0000 mL | Freq: Two times a day (BID) | INTRAVENOUS | Status: DC

## 2016-10-28 MED ORDER — ONDANSETRON HCL 4 MG PO TABS: 4.0000 mg | ORAL_TABLET | Freq: Four times a day (QID) | ORAL | Status: DC | PRN

## 2016-10-28 MED ORDER — PHENOL 1.4 % MT LIQD: 1.0000 | OROMUCOSAL | Status: DC | PRN

## 2016-10-28 MED ORDER — MORPHINE SULFATE (PF) 4 MG/ML IV SOLN
4.0000 mg | INTRAVENOUS | Status: DC | PRN
  Administered 2016-10-28: 4 mg via INTRAVENOUS
  Filled 2016-10-28: qty 1

## 2016-10-28 MED ORDER — ONDANSETRON HCL 4 MG/2ML IJ SOLN
4.0000 mg | Freq: Four times a day (QID) | INTRAMUSCULAR | Status: DC | PRN
  Administered 2016-10-28: 4 mg via INTRAVENOUS

## 2016-10-28 MED ORDER — BUPIVACAINE-EPINEPHRINE (PF) 0.5% -1:200000 IJ SOLN
INTRAMUSCULAR | Status: DC | PRN
  Administered 2016-10-28: 10 mL via PERINEURAL

## 2016-10-28 MED ORDER — MIDAZOLAM HCL 2 MG/2ML IJ SOLN
INTRAMUSCULAR | Status: AC
  Filled 2016-10-28: qty 2

## 2016-10-28 MED ORDER — ACETAMINOPHEN 325 MG PO TABS: 650.0000 mg | ORAL_TABLET | ORAL | Status: DC | PRN

## 2016-10-28 MED ORDER — GLIMEPIRIDE 2 MG PO TABS: 4.0000 mg | ORAL_TABLET | Freq: Every day | ORAL | Status: DC

## 2016-10-28 MED ORDER — BISACODYL 10 MG RE SUPP: 10.0000 mg | Freq: Every day | RECTAL | Status: DC | PRN

## 2016-10-28 MED ORDER — 0.9 % SODIUM CHLORIDE (POUR BTL) OPTIME
TOPICAL | Status: DC | PRN
  Administered 2016-10-28: 1000 mL

## 2016-10-28 MED ORDER — VANCOMYCIN HCL 1000 MG IV SOLR
INTRAVENOUS | Status: DC | PRN
  Administered 2016-10-28: 1000 mg via TOPICAL

## 2016-10-28 MED ORDER — FENTANYL CITRATE (PF) 250 MCG/5ML IJ SOLN
INTRAMUSCULAR | Status: AC
  Filled 2016-10-28: qty 5

## 2016-10-28 MED ORDER — ALBUMIN HUMAN 5 % IV SOLN
INTRAVENOUS | Status: DC | PRN
  Administered 2016-10-28: 13:00:00 via INTRAVENOUS

## 2016-10-28 SURGICAL SUPPLY — 54 items
BAG DECANTER FOR FLEXI CONT (MISCELLANEOUS) ×3 IMPLANT
BENZOIN TINCTURE PRP APPL 2/3 (GAUZE/BANDAGES/DRESSINGS) ×3 IMPLANT
BLADE CLIPPER SURG (BLADE) IMPLANT
BUR MATCHSTICK NEURO 3.0 LAGG (BURR) ×3 IMPLANT
BUR PRECISION FLUTE 6.0 (BURR) ×6 IMPLANT
CANISTER SUCT 3000ML PPV (MISCELLANEOUS) ×3 IMPLANT
CARTRIDGE OIL MAESTRO DRILL (MISCELLANEOUS) ×1 IMPLANT
CLOSURE WOUND 1/2 X4 (GAUZE/BANDAGES/DRESSINGS) ×1
CONT SPEC 4OZ CLIKSEAL STRL BL (MISCELLANEOUS) ×3 IMPLANT
DIFFUSER DRILL AIR PNEUMATIC (MISCELLANEOUS) ×3 IMPLANT
DRAPE LAPAROTOMY 100X72X124 (DRAPES) ×3 IMPLANT
DRAPE MICROSCOPE LEICA (MISCELLANEOUS) ×3 IMPLANT
DRAPE POUCH INSTRU U-SHP 10X18 (DRAPES) ×3 IMPLANT
DRAPE SURG 17X23 STRL (DRAPES) ×12 IMPLANT
ELECT BLADE 4.0 EZ CLEAN MEGAD (MISCELLANEOUS) ×3
ELECT REM PT RETURN 9FT ADLT (ELECTROSURGICAL) ×3
ELECTRODE BLDE 4.0 EZ CLN MEGD (MISCELLANEOUS) ×1 IMPLANT
ELECTRODE REM PT RTRN 9FT ADLT (ELECTROSURGICAL) ×1 IMPLANT
GAUZE SPONGE 4X4 12PLY STRL (GAUZE/BANDAGES/DRESSINGS) ×3 IMPLANT
GAUZE SPONGE 4X4 16PLY XRAY LF (GAUZE/BANDAGES/DRESSINGS) IMPLANT
GLOVE BIO SURGEON STRL SZ8 (GLOVE) ×6 IMPLANT
GLOVE BIO SURGEON STRL SZ8.5 (GLOVE) ×3 IMPLANT
GLOVE EXAM NITRILE LRG STRL (GLOVE) IMPLANT
GLOVE EXAM NITRILE XL STR (GLOVE) IMPLANT
GLOVE EXAM NITRILE XS STR PU (GLOVE) IMPLANT
GLOVE INDICATOR 8.5 STRL (GLOVE) ×3 IMPLANT
GOWN STRL REUS W/ TWL LRG LVL3 (GOWN DISPOSABLE) IMPLANT
GOWN STRL REUS W/ TWL XL LVL3 (GOWN DISPOSABLE) ×1 IMPLANT
GOWN STRL REUS W/TWL 2XL LVL3 (GOWN DISPOSABLE) IMPLANT
GOWN STRL REUS W/TWL LRG LVL3 (GOWN DISPOSABLE)
GOWN STRL REUS W/TWL XL LVL3 (GOWN DISPOSABLE) ×2
HEMOSTAT POWDER KIT SURGIFOAM (HEMOSTASIS) ×6 IMPLANT
KIT BASIN OR (CUSTOM PROCEDURE TRAY) ×3 IMPLANT
KIT ROOM TURNOVER OR (KITS) ×3 IMPLANT
NEEDLE HYPO 21X1.5 SAFETY (NEEDLE) IMPLANT
NEEDLE HYPO 22GX1.5 SAFETY (NEEDLE) ×3 IMPLANT
NS IRRIG 1000ML POUR BTL (IV SOLUTION) ×3 IMPLANT
OIL CARTRIDGE MAESTRO DRILL (MISCELLANEOUS) ×3
PACK LAMINECTOMY NEURO (CUSTOM PROCEDURE TRAY) ×3 IMPLANT
PAD ARMBOARD 7.5X6 YLW CONV (MISCELLANEOUS) ×9 IMPLANT
PATTIES SURGICAL .5 X1 (DISPOSABLE) IMPLANT
PATTIES SURGICAL 1X1 (DISPOSABLE) ×3 IMPLANT
RUBBERBAND STERILE (MISCELLANEOUS) ×6 IMPLANT
SPONGE SURGIFOAM ABS GEL SZ50 (HEMOSTASIS) ×3 IMPLANT
STRIP CLOSURE SKIN 1/2X4 (GAUZE/BANDAGES/DRESSINGS) ×2 IMPLANT
SUT VIC AB 1 CT1 18XBRD ANBCTR (SUTURE) ×2 IMPLANT
SUT VIC AB 1 CT1 8-18 (SUTURE) ×4
SUT VIC AB 2-0 CP2 18 (SUTURE) ×6 IMPLANT
SWAB COLLECTION DEVICE MRSA (MISCELLANEOUS) ×3 IMPLANT
SWAB CULTURE ESWAB REG 1ML (MISCELLANEOUS) ×3 IMPLANT
TAPE CLOTH SURG 6X10 WHT LF (GAUZE/BANDAGES/DRESSINGS) ×3 IMPLANT
TOWEL GREEN STERILE (TOWEL DISPOSABLE) ×3 IMPLANT
TOWEL GREEN STERILE FF (TOWEL DISPOSABLE) ×3 IMPLANT
WATER STERILE IRR 1000ML POUR (IV SOLUTION) ×3 IMPLANT

## 2016-10-28 NOTE — Transfer of Care (Signed)
Immediate Anesthesia Transfer of Care Note  Patient: Miguel White  Procedure(s) Performed: LAMINECTOMY BILATERAL LUMBAR 4- LUMBAR 5 (Bilateral )  Patient Location: PACU  Anesthesia Type:General  Level of Consciousness: awake, alert  and oriented  Airway & Oxygen Therapy: Patient Spontanous Breathing and Patient connected to nasal cannula oxygen  Post-op Assessment: Report given to RN and Post -op Vital signs reviewed and stable  Post vital signs: Reviewed and stable  Last Vitals:  Vitals:   10/28/16 0905  BP: (!) 148/66  Pulse: 72  Resp: 20  Temp: 37.2 C  SpO2: 99%    Last Pain:  Vitals:   10/28/16 0939  TempSrc:   PainSc: 3          Complications: No apparent anesthesia complications

## 2016-10-28 NOTE — Anesthesia Procedure Notes (Signed)
Procedure Name: Intubation Date/Time: 10/28/2016 11:02 AM Performed by: Eligha Bridegroom Pre-anesthesia Checklist: Patient identified, Emergency Drugs available, Suction available, Patient being monitored and Timeout performed Patient Re-evaluated:Patient Re-evaluated prior to induction Oxygen Delivery Method: Circle system utilized Preoxygenation: Pre-oxygenation with 100% oxygen Induction Type: IV induction and Rapid sequence Ventilation: Oral airway inserted - appropriate to patient size Laryngoscope Size: Glidescope Grade View: Grade III Tube type: Oral Tube size: 7.5 mm Number of attempts: 1 Airway Equipment and Method: Stylet and Video-laryngoscopy Placement Confirmation: ETT inserted through vocal cords under direct vision Secured at: 23 cm Tube secured with: Tape Dental Injury: Teeth and Oropharynx as per pre-operative assessment

## 2016-10-28 NOTE — Progress Notes (Signed)
RT NOTE:  RT assisted patient with Home CPAP unit setup. All equipment looks good, no defect or damage. All equipment belongs to patient. No O2 needed. Sterile water added.

## 2016-10-28 NOTE — Progress Notes (Signed)
Subjective:  the patient is somnolent but easily arousable. He is in no apparent distress.  Objective: Vital signs in last 24 hours: Temp:  [98.9 F (37.2 C)-99.1 F (37.3 C)] (P) 99.1 F (37.3 C) (10/17 1504) Pulse Rate:  [72-75] (P) 75 (10/17 1504) Resp:  [16-20] (P) 16 (10/17 1504) BP: (148)/(66) (P) 117/66 (10/17 1504) SpO2:  [99 %] 99 % (10/17 0905) Weight:  [109.3 kg (241 lb)] 109.3 kg (241 lb) (10/17 0939)  Intake/Output from previous day: No intake/output data recorded. Intake/Output this shift: Total I/O In: 2050 [I.V.:1800; IV Piggyback:250] Out: 225 [Blood:225]  Physical exam the patient is somnolent but arousable. His strength is grossly normal spinal gastrocnemius and extensor hallucis longus.  Lab Results: No results for input(s): WBC, HGB, HCT, PLT in the last 72 hours. BMET No results for input(s): NA, K, CL, CO2, GLUCOSE, BUN, CREATININE, CALCIUM in the last 72 hours.  Studies/Results: No results found.  Assessment/Plan: The patient is doing well. I spoke with his wife.  LOS: 0 days     Devan Danzer D 10/28/2016, 3:10 PM

## 2016-10-28 NOTE — Anesthesia Preprocedure Evaluation (Addendum)
Anesthesia Evaluation  Patient identified by MRN, date of birth, ID band Patient awake    Reviewed: Allergy & Precautions, H&P , NPO status , Patient's Chart, lab work & pertinent test results  Airway Mallampati: III  TM Distance: >3 FB Neck ROM: Full    Dental no notable dental hx. (+) Teeth Intact, Dental Advisory Given   Pulmonary sleep apnea and Continuous Positive Airway Pressure Ventilation , former smoker,    Pulmonary exam normal breath sounds clear to auscultation       Cardiovascular hypertension, Pt. on medications  Rhythm:Regular Rate:Normal     Neuro/Psych Depression negative neurological ROS     GI/Hepatic negative GI ROS, Neg liver ROS,   Endo/Other  diabetes, Insulin Dependent, Oral Hypoglycemic AgentsMorbid obesity  Renal/GU negative Renal ROS  negative genitourinary   Musculoskeletal  (+) Arthritis , Osteoarthritis,    Abdominal   Peds  Hematology negative hematology ROS (+)   Anesthesia Other Findings   Reproductive/Obstetrics negative OB ROS                            Anesthesia Physical Anesthesia Plan  ASA: III  Anesthesia Plan: General   Post-op Pain Management:    Induction: Intravenous  PONV Risk Score and Plan: 3 and Ondansetron, Dexamethasone and Midazolam  Airway Management Planned: Oral ETT  Additional Equipment:   Intra-op Plan:   Post-operative Plan: Extubation in OR  Informed Consent: I have reviewed the patients History and Physical, chart, labs and discussed the procedure including the risks, benefits and alternatives for the proposed anesthesia with the patient or authorized representative who has indicated his/her understanding and acceptance.   Dental advisory given  Plan Discussed with: CRNA  Anesthesia Plan Comments:         Anesthesia Quick Evaluation

## 2016-10-28 NOTE — Op Note (Signed)
Brief history: The patient Is a 71 year old white male on whom I performed an L4-5 decompression, instrumentation, and fusion inJanuary 2018. The patient initially did very well but developed recurrent back and leg pain consistent with a lumbar radiculopathy. He has failed medical management and was worked up with a lumbar MRI. 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 I discussed the situation with the patient and his wife. We discussed the various treatment options. He has decided to proceed with surgery after weighing the risks, benefits and alternatives.  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  Surgeon : Dr. Earle Gell  Assistant: Dr. Saintclair Halsted  Anesthesia: Gen. Tracheal  Estimated blood loss: 125 mL  Specimens: Cyst and wound culture  Drains: None  Cup medications: None  Description of procedure: The patient was brought to the operating room by the anesthesia team. General endotracheal anesthesia was induced. The patient was turned to the prone position on the Wilson frame. His lumbosacral region was then prepared with Betadine scrub and Betadine solution. Sterile drapes were applied. I then injected the area to be incised with Marcaine with epinephrine solution.I just scalpel to make a incision through her his previous surgical scar at L4-5. I used the cautery to perform a bilateral subperiosteal dissection exposing the spinous process and lamina of L4 and L5. We confirmed our location based on his old instrumentation. We inserted the [facility] retractor for exposure.  We began the decompression by incising the interspinous ligament at L3-4 and L4-5. We removed the L4 spinous process with the [place] . We brought the operative microscope into the field and  under his medication and illumination we completed the microdissection/decompression.I then used the high-speed drill to perform bilateral L4-5 laminotomies, extending his previous laminectomy in a cephalad direction until we encountered some relatively non-scarred down dura.there was quite a bit of scar tissue. I used microdissection to free up the thecal sac at the bilateral L4 and L5 nerve roots from the scar tissue. As expected we encountered cystic structures bilaterally at L4-5. The best I can tell these were inflammatory reactions from the bone graft extender. I did not see any evidence of infection but to be on the safe side took wound cultures at the L4-5 cysts. We sent the cyst off to the pathologist for permanent identification, but I did not see anything that looked like malignancy.We removed the cyst bilaterally with Kerrison punches and pituitary forceps. I removed some bone graft extender from the interspace at L4-5 bilaterally. We performed foraminotomies about the bilateral L4 and L5 nerve root completing the decompression.  We then obtained hemostasis using bipolar left cautery. We irrigated the wound out with bacitracin solution. We then removed the retractor. We then reapproximated patient's thoraco lumbar fascia with interrupted #1 Vicryl suture. We reapproximated the subcutaneous tissue with interrupted 2-0 Vicryl suture. We reapproximated the skin with Steri-Strips and benzoin. The wound was then coated with bacitracin ointment. A sterile dressing was applied. The drapes were removed. The patient was subsequently returned to the supine position where he was extubated by the anesthesia team and transported to the post anesthesia care unit in stable condition. By report all sponge, instrument, and needle counts were correct at the end of this case.

## 2016-10-28 NOTE — Anesthesia Postprocedure Evaluation (Signed)
Anesthesia Post Note  Patient: Miguel White  Procedure(s) Performed: LAMINECTOMY BILATERAL LUMBAR 4- LUMBAR 5 (Bilateral )     Patient location during evaluation: PACU Anesthesia Type: General Level of consciousness: awake and alert Pain management: pain level controlled Vital Signs Assessment: post-procedure vital signs reviewed and stable Respiratory status: spontaneous breathing, nonlabored ventilation and respiratory function stable Cardiovascular status: blood pressure returned to baseline and stable Postop Assessment: no apparent nausea or vomiting Anesthetic complications: no    Last Vitals:  Vitals:   10/28/16 1545 10/28/16 1600  BP: 122/67   Pulse: 70 67  Resp: 12 20  Temp:    SpO2: 98% 95%    Last Pain:  Vitals:   10/28/16 1600  TempSrc:   PainSc: 6                  Mckenze Slone,W. EDMOND

## 2016-10-28 NOTE — H&P (Signed)
Subjective: The patient is a 71 year old white male on whom I performed an L4-5 decompression and fusion about 9 months ago. The patient initially did very well but has developed recurrent back, buttock and leg pain consistent with a lumbar radiculopathy. He failed medical management and was worked up with a lumbar MRI. This demonstrated the patient had stenosis at L4-5 with bilateral cyst. I discussed the various treatment options with the patient including surgery. He has weighed the risks, benefits, and alternative surgery and decided to proceed with bilateral L4-5 laminotomy and foraminotomy and resection of the cyst.   Past Medical History:  Diagnosis Date  . Allergic rhinitis   . DDD (degenerative disc disease), cervical   . Depressive disorder   . Diabetes mellitus without complication ([place])   . Diabetic peripheral neuropathy ([place])   . Glaucoma   . History of chickenpox   . History of claustrophobia   . HNP (herniated nucleus pulposus with myelopathy), thoracic   . Hyperlipidemia   . Hypertension   . Hypertrophy of prostate without urinary obstruction   . Lumbar radiculitis   . Lumbar stenosis   . Motion sickness    back seat of cars  . Obesity   . OSA on CPAP   . Shingles     Past Surgical History:  Procedure Laterality Date  . BROW LIFT Bilateral 05/26/2016   Procedure: BLEPHAROPLASTY UPPER EYELID W/EXCESS SKIN;  Surgeon: Karle Starch, MD;  Location: [place];  Service: Ophthalmology;  Laterality: Bilateral;  . CERVICAL FUSION  2012  . COLONOSCOPY WITH PROPOFOL N/A 07/01/2015   Procedure: COLONOSCOPY WITH PROPOFOL;  Surgeon: Manya Silvas, MD;  Location: [facility] ENDOSCOPY;  Service: Endoscopy;  Laterality: N/A;  . DECOMPRESSIVE LUMBAR LAMINECTOMY LEVEL 4  01/20/2016   [facility], Dr. Orinda Kenner  . POSTERIOR CERVICAL FUSION/FORAMINOTOMY  2012  . PTOSIS REPAIR Bilateral 05/26/2016   Procedure: PTOSIS REPAIR RESECT EX;  Surgeon: Karle Starch, MD;  Location: [place];  Service: Ophthalmology;  Laterality: Bilateral;  Diabetic - insulin and oral meds sleep apnea  . TONSILLECTOMY      Allergies  Allergen Reactions  . Pilocarpine Hcl Other (See Comments)    UNSPECIFIED MECHANISM Corneal lesions    Social History  Substance Use Topics  . Smoking status: Former Smoker    Quit date: 01/07/1984  . Smokeless tobacco: Never Used  . Alcohol use 8.4 oz/week    14 Glasses of wine per week     Comment:      Family History  Problem Relation Age of Onset  . Pancreatic cancer Mother   . Liver cancer Father    Prior to Admission medications   Medication Sig Start Date End Date Taking? Authorizing Provider  amLODipine-benazepril (LOTREL) 10-20 MG capsule Take 1 capsule by mouth daily.    Yes [provider]  Ascorbic Acid (VITAMIN C PO) Take 1,000 mg by mouth 2 (two) times daily.    Yes [provider]  aspirin EC 81 MG tablet Take 81 mg by mouth daily.   Yes [provider]  cetirizine (ZYRTEC) 10 MG tablet Take 10 mg by mouth daily.    Yes [provider]  citalopram (CELEXA) 20 MG tablet Take 20 mg by mouth daily.   Yes [provider]  ezetimibe-simvastatin (VYTORIN) 10-20 MG tablet Take 1 tablet by mouth daily at 6 PM.    Yes [provider]  fluticasone (FLONASE) 50 MCG/ACT nasal spray Place 2 sprays into  both nostrils daily as needed for allergies.    Yes [provider]  gabapentin (NEURONTIN) 300 MG capsule Take 2 capsules (600 mg total) by mouth 2 (two) times daily. 01/24/16  Yes Newman Pies, MD  glimepiride (AMARYL) 4 MG tablet Take 4 mg by mouth daily with breakfast.   Yes [provider]  hydrochlorothiazide (HYDRODIURIL) 25 MG tablet Take 25 mg by mouth every evening.    Yes [provider]  ibuprofen (ADVIL,MOTRIN) 200 MG tablet Take 400 mg by mouth every 6 (six) hours as needed for headache or moderate pain.   Yes [provider]  insulin glargine  (LANTUS) 100 UNIT/ML injection Inject 70 Units into the skin at bedtime.    Yes [provider]  latanoprost (XALATAN) 0.005 % ophthalmic solution Place 1 drop into both eyes at bedtime.    Yes [provider]  metFORMIN (GLUCOPHAGE) 1000 MG tablet Take 1,000 mg by mouth 2 (two) times daily with a meal.   Yes [provider]  Multiple Vitamin (MULTIVITAMIN) tablet Take 1 tablet by mouth daily.   Yes [provider]  sitaGLIPtin (JANUVIA) 100 MG tablet Take 100 mg by mouth daily.   Yes [provider]  tamsulosin (FLOMAX) 0.4 MG CAPS capsule Take 0.4 mg by mouth daily.   Yes [provider]  timolol (TIMOPTIC) 0.5 % ophthalmic solution Place 1 drop into both eyes every evening.    Yes [provider]  triamcinolone cream (KENALOG) 0.1 % Apply 1 application topically as needed (for rash).    Yes [provider]  erythromycin [facility]) ophthalmic ointment Use a small amount on your sutures 4 times a day for the next 2 weeks. Switch to Aquaphor ointment should allergy develop. Patient not taking: Reported on 10/19/2016 05/26/16   Karle Starch, MD  oxyCODONE 10 MG TABS Take 1 tablet (10 mg total) by mouth every 3 (three) hours as needed for moderate pain. Patient not taking: Reported on 05/26/2016 01/24/16   Newman Pies, MD  oxyCODONE-acetaminophen (PERCOCET) 5-325 MG tablet Take 1 tablet by mouth every 4 (four) hours as needed for severe pain. Patient not taking: Reported on 10/19/2016 05/26/16   Karle Starch, MD     Review of Systems  Positive ROS: As above  All other systems have been reviewed and were otherwise negative with the exception of those mentioned in the HPI and as above.  Objective: Vital signs in last 24 hours: Temp:  [98.9 F (37.2 C)] 98.9 F (37.2 C) (10/17 0905) Pulse Rate:  [72] 72 (10/17 0905) Resp:  [20] 20 (10/17 0905) BP: (148)/(66) 148/66 (10/17 0905) SpO2:  [99 %] 99 % (10/17 0905) Weight:   [109.3 kg (241 lb)] 109.3 kg (241 lb) (10/17 0939)  General Appearance: Alert Head: Normocephalic, without obvious abnormality, atraumatic Eyes: PERRL, conjunctiva/corneas clear, EOM's intact,    Ears: Normal  Throat: Normal  Neck: Supple, Back: unremarkable, the patient's lumbar incision is well-healed. Lungs: Clear to auscultation bilaterally, respirations unlabored Heart: Regular rate and rhythm, no murmur, rub or gallop Abdomen: Soft, non-tender Extremities: Extremities normal, atraumatic, no cyanosis or edema Skin: unremarkable  NEUROLOGIC:   Mental status: alert and oriented, Motor Exam - grossly normal, except he has slight weakness in his bilateral extensor hallicus longus. Sensory Exam - grossly normal Reflexes:  Coordination - grossly normal Gait - grossly normal Balance - grossly normal Cranial Nerves: I: smell Not tested  II: visual acuity  OS: Normal  OD: Normal  II: visual fields Full to confrontation  II: pupils Equal, round, reactive to light  III,VII: ptosis None  III,IV,VI: extraocular muscles  Full ROM  V: mastication Normal  V: facial light touch sensation  Normal  V,VII: corneal reflex  Present  VII: facial muscle function - upper  Normal  VII: facial muscle function - lower Normal  VIII: hearing Not tested  IX: soft palate elevation  Normal  IX,X: gag reflex Present  XI: trapezius strength  5/5  XI: sternocleidomastoid strength 5/5  XI: neck flexion strength  5/5  XII: tongue strength  Normal    Data Review Lab Results  Component Value Date   WBC 6.0 10/22/2016   HGB 14.2 10/22/2016   HCT 40.7 10/22/2016   MCV 92.1 10/22/2016   PLT 147 (L) 10/22/2016   Lab Results  Component Value Date   NA 136 10/22/2016   K 3.6 10/22/2016   CL 102 10/22/2016   CO2 25 10/22/2016   BUN 14 10/22/2016   CREATININE 0.63 10/22/2016   GLUCOSE 156 (H) 10/22/2016   No results found for: INR, PROTIME  Assessment/Plan: L4-5 spinal stenosis, lumbago,  lumbar radiculopathy: I have discussed the situation with the patient and his wife and reviewed his imaging studies with him. We have discussed the various treatment options including a redo L4-5 laminotomy and foraminotomy with resection of the cyst. I have described the surgery to him. I have shown him surgical models. I have provided him a surgical pamphlet. I have provided him a surgical pamphlet. We have discussed the risks, benefits, alternatives, expected postoperative course, and likelihood of achieving our goals with surgery. I have answered all their questions. He has decided to proceed with surgery. He has decided to proceed with surgery.   Layana Konkel D 10/28/2016 10:03 AM

## 2016-10-28 NOTE — Evaluation (Signed)
Physical Therapy Evaluation Patient Details Name: Miguel White MRN: [id number] DOB: August 03, 1945 Today's Date: 10/28/2016   History of Present Illness  Pt is a 71 y/o male s/p L4-5 redo laminectomy and microdiscectomy. PMH includes DM, HTN, depression, peripheral neuropathy, OSA on CPAP, glaucoma, and s/p cervical fusion.   Clinical Impression  Pt is s/p back surgery above with deficits below. PTA, pt was independent with functional mobility. Upon eval, pt limited by post op pain and weakness. Required min guard assist for mobility. Pt reports wife will be able to assist as needed upon d/c home. Has all necessary DME. Will continue to follow acutely to maximize functional mobility independence and safety.     Follow Up Recommendations No PT follow up;Supervision - Intermittent    Equipment Recommendations  None recommended by PT    Recommendations for Other Services       Precautions / Restrictions Precautions Precautions: Back Precaution Booklet Issued: Yes (comment) Precaution Comments: Reviewed back precautions with pt.  Restrictions Weight Bearing Restrictions: No      Mobility  Bed Mobility Overal bed mobility: Needs Assistance Bed Mobility: Rolling;Sidelying to Sit;Sit to Sidelying Rolling: Supervision Sidelying to sit: Supervision     Sit to sidelying: Supervision General bed mobility comments: Supervision for safety and to ensure log roll technique   Transfers Overall transfer level: Needs assistance Equipment used: None Transfers: Sit to/from Stand Sit to Stand: Min guard         General transfer comment: Min guard for safety.   Ambulation/Gait Ambulation/Gait assistance: Min guard Ambulation Distance (Feet): 300 Feet Assistive device:  (IV pole ) Gait Pattern/deviations: Step-through pattern;Decreased stride length Gait velocity: Decreased Gait velocity interpretation: Below normal speed for age/gender General Gait Details: Slow, guarded gait. Use  of IV pole for steadying. Min guard for safety.   Stairs            Wheelchair Mobility    Modified Rankin (Stroke Patients Only)       Balance Overall balance assessment: Needs assistance Sitting-balance support: No upper extremity supported;Feet supported Sitting balance-Leahy Scale: Good     Standing balance support: No upper extremity supported;During functional activity Standing balance-Leahy Scale: Fair Standing balance comment: Able to maintain static standing without UE support                              Pertinent Vitals/Pain Pain Assessment: 0-10 Pain Score: 5  Pain Location: back  Pain Descriptors / Indicators: Aching;Operative site guarding Pain Intervention(s): Limited activity within patient's tolerance;Monitored during session;Repositioned    Home Living Family/patient expects to be discharged to:: Private residence Living Arrangements: Spouse/significant other Available Help at Discharge: Family;Available 24 hours/day Type of Home: House Home Access: Stairs to enter Entrance Stairs-Rails: None Entrance Stairs-Number of Steps: 2 Home Layout: Two level Home Equipment: Walker - 2 wheels;Bedside commode;Shower seat - built in      Prior Function Level of Independence: Independent               Hand Dominance   Dominant Hand: Right    Extremity/Trunk Assessment   Upper Extremity Assessment Upper Extremity Assessment: Overall WFL for tasks assessed    Lower Extremity Assessment Lower Extremity Assessment: LLE deficits/detail LLE Deficits / Details: Reports symptoms in LLE are better than baseline. Does report fatigue in L hips.     Cervical / Trunk Assessment Cervical / Trunk Assessment: Other exceptions Cervical / Trunk Exceptions: s/p laminectomy  Communication   Communication: No difficulties  Cognition Arousal/Alertness: Awake/alert Behavior During Therapy: WFL for tasks assessed/performed Overall Cognitive  Status: Within Functional Limits for tasks assessed                                        General Comments General comments (skin integrity, edema, etc.): Pt's wife present during session. Educated about generalized exercise program.     Exercises     Assessment/Plan    PT Assessment Patient needs continued PT services  PT Problem List Decreased strength;Decreased balance;Decreased mobility;Decreased knowledge of precautions;Pain       PT Treatment Interventions Stair training;Gait training;Functional mobility training;Therapeutic activities;Therapeutic exercise;Balance training;Neuromuscular re-education;Patient/family education    PT Goals (Current goals can be found in the Care Plan section)  Acute Rehab PT Goals Patient Stated Goal: to go home  PT Goal Formulation: With patient/family Time For Goal Achievement: 11/04/16 Potential to Achieve Goals: Good    Frequency Min 5X/week   Barriers to discharge        Co-evaluation               AM-PAC PT "6 Clicks" Daily Activity  Outcome Measure Difficulty turning over in bed (including adjusting bedclothes, sheets and blankets)?: None Difficulty moving from lying on back to sitting on the side of the bed? : None Difficulty sitting down on and standing up from a chair with arms (e.g., wheelchair, bedside commode, etc,.)?: Unable Help needed moving to and from a bed to chair (including a wheelchair)?: A Little Help needed walking in hospital room?: A Little Help needed climbing 3-5 steps with a railing? : A Little 6 Click Score: 18    End of Session Equipment Utilized During Treatment: Gait belt Activity Tolerance: Patient tolerated treatment well Patient left: in bed;with call bell/phone within reach;with family/visitor present Nurse Communication: Mobility status PT Visit Diagnosis: Other abnormalities of gait and mobility (R26.89);Pain Pain - part of body:  (back )    Time: 8676-1950 PT Time  Calculation (min) (ACUTE ONLY): 20 min   Charges:   PT Evaluation $PT Eval Low Complexity: 1 Low     PT G Codes:        Leighton Ruff, PT, DPT  Acute Rehabilitation Services  Pager: [phone]   Rudean Hitt 10/28/2016, 6:23 PM

## 2016-10-29 ENCOUNTER — Encounter (HOSPITAL_COMMUNITY): Payer: Self-pay | Admitting: Neurosurgery

## 2016-10-29 LAB — GLUCOSE, CAPILLARY: Glucose-Capillary: 215 mg/dL — ABNORMAL HIGH (ref 65–99)

## 2016-10-29 MED ORDER — CYCLOBENZAPRINE HCL 10 MG PO TABS: 10.0000 mg | ORAL_TABLET | Freq: Three times a day (TID) | ORAL | 1 refills | Status: DC | PRN

## 2016-10-29 MED ORDER — OXYCODONE HCL 5 MG PO TABS: 5.0000 mg | ORAL_TABLET | ORAL | 0 refills | Status: AC | PRN

## 2016-10-29 MED ORDER — DOCUSATE SODIUM 100 MG PO CAPS: 100.0000 mg | ORAL_CAPSULE | Freq: Two times a day (BID) | ORAL | 0 refills | Status: DC

## 2016-10-29 MED ORDER — CEPHALEXIN 500 MG PO CAPS: 500.0000 mg | ORAL_CAPSULE | Freq: Four times a day (QID) | ORAL | 0 refills | Status: DC

## 2016-10-29 MED ORDER — CEPHALEXIN 500 MG PO CAPS
500.0000 mg | ORAL_CAPSULE | Freq: Four times a day (QID) | ORAL | Status: DC
  Filled 2016-10-29: qty 1

## 2016-10-29 NOTE — Progress Notes (Signed)
Physical Therapy Treatment Patient Details Name: Miguel White MRN: [id number] DOB: 10-14-1945 Today's Date: 10/29/2016    History of Present Illness Pt is a 71 y/o male s/p L4-5 redo laminectomy and microdiscectomy. PMH includes DM, HTN, depression, peripheral neuropathy, OSA on CPAP, glaucoma, and s/p cervical fusion.     PT Comments    Pt progressing towards physical therapy goals. Was able to perform transfers and ambulation with gross min guard assist to supervision for safety. Pt able to recall 3/3 precautions. Pt and wife were educated on stair training, car transfer, walking program, positioning, and general safety with activity progression. He and wife anticipate d/c home this morning.   Follow Up Recommendations  No PT follow up;Supervision - Intermittent     Equipment Recommendations  None recommended by PT    Recommendations for Other Services       Precautions / Restrictions Precautions Precautions: Back Precaution Booklet Issued: Yes (comment) Precaution Comments: Reviewed verbally and pt was cued for precautions during functional mobility.  Restrictions Weight Bearing Restrictions: No    Mobility  Bed Mobility Overal bed mobility: Modified Independent Bed Mobility: Rolling;Sidelying to Sit;Sit to Sidelying Rolling: Modified independent (Device/Increase time) Sidelying to sit: Modified independent (Device/Increase time)       General bed mobility comments: HOB elevated as pt was initiating log roll before PT could flatten bed. No assist required.   Transfers Overall transfer level: Needs assistance Equipment used: None Transfers: Sit to/from Stand Sit to Stand: Supervision         General transfer comment: Pt demonstrated proper hand placement on seated surface for safety.   Ambulation/Gait Ambulation/Gait assistance: Min guard Ambulation Distance (Feet): 500 Feet Assistive device:  (IV pole ) Gait Pattern/deviations: Step-through  pattern;Decreased stride length Gait velocity: Decreased Gait velocity interpretation: Below normal speed for age/gender General Gait Details: Slow, guarded gait. Use of IV pole for steadying. Min guard for safety.    Stairs Stairs: Yes   Stair Management: One rail Right;Step to pattern;Sideways Number of Stairs: 2 General stair comments: Pt only willing to attempt 2 stairs as this is what he has at home to enter. Planning on staying on the main level so he does not have to negotiate a full flight of stairs immediately upon return home. Able to complete at a supervision level - no assist required.   Wheelchair Mobility    Modified Rankin (Stroke Patients Only)       Balance Overall balance assessment: Needs assistance Sitting-balance support: No upper extremity supported;Feet supported Sitting balance-Leahy Scale: Good     Standing balance support: No upper extremity supported;During functional activity Standing balance-Leahy Scale: Fair Standing balance comment: Able to maintain static standing without UE support                             Cognition Arousal/Alertness: Awake/alert Behavior During Therapy: WFL for tasks assessed/performed Overall Cognitive Status: Within Functional Limits for tasks assessed                                        Exercises      General Comments        Pertinent Vitals/Pain Pain Assessment: 0-10 Pain Score: 4  Pain Location: back  Pain Descriptors / Indicators: Aching;Operative site guarding Pain Intervention(s): Limited activity within patient's tolerance;Monitored during session;Repositioned    Home Living  Prior Function            PT Goals (current goals can now be found in the care plan section) Acute Rehab PT Goals Patient Stated Goal: to go home  PT Goal Formulation: With patient/family Time For Goal Achievement: 11/04/16 Potential to Achieve Goals:  Good Progress towards PT goals: Progressing toward goals    Frequency    Min 5X/week      PT Plan Current plan remains appropriate    Co-evaluation              AM-PAC PT "6 Clicks" Daily Activity  Outcome Measure  Difficulty turning over in bed (including adjusting bedclothes, sheets and blankets)?: None Difficulty moving from lying on back to sitting on the side of the bed? : None Difficulty sitting down on and standing up from a chair with arms (e.g., wheelchair, bedside commode, etc,.)?: A Little Help needed moving to and from a bed to chair (including a wheelchair)?: A Little Help needed walking in hospital room?: A Little Help needed climbing 3-5 steps with a railing? : A Little 6 Click Score: 20    End of Session Equipment Utilized During Treatment: Gait belt Activity Tolerance: Patient tolerated treatment well Patient left: in bed;with call bell/phone within reach;with family/visitor present Nurse Communication: Mobility status PT Visit Diagnosis: Other abnormalities of gait and mobility (R26.89);Pain Pain - part of body:  (back )     Time: 4536-4680 PT Time Calculation (min) (ACUTE ONLY): 14 min  Charges:  $Gait Training: 8-22 mins                    G Codes:       Miguel White, PT, DPT Acute Rehabilitation Services Pager: [phone]    Miguel White 10/29/2016, 9:36 AM

## 2016-10-29 NOTE — Progress Notes (Signed)
Pt and wife given D/C instructions with Rx's, verbal understanding was provided. Pt's incision is clean and dry with no sign of infection. Pt's IV was removed prior to D/C. Pt D/C'd home via wheelchair @ 0930 per MD order. Pt is stable @ D/C and has no other needs a this time. Holli Humbles, RN

## 2016-10-29 NOTE — Discharge Summary (Signed)
Physician Discharge Summary  Patient ID: Miguel White MRN: [id number] DOB/AGE: 04/10/1945 71 y.o.  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 The surgery went well.  The patient's postoperative course was unremarkable. He had some bloody drainage from his wound. He was started on Keflex empirically. On postoperative day #1 he requested discharge home. The patient, and his wife, were given oral and written discharge instructions. He was instructed on when necessary dressing changes. All their questions were answered.  Consults: Physical therapy Significant Diagnostic Studies: None Treatments: Bilateral L4-5 redo laminectomy for resection of intraspinal cyst using microdissection Discharge Exam: Blood pressure (!) 147/71, pulse 91, temperature 98.8 F (37.1 C), temperature source Oral, resp. rate 18, height 5\' 5"  (1.651 m), weight 109.3 kg (241 lb), SpO2 93 %. The patient is alert and pleasant. His strength is grossly normal spinal lower extremities.  Disposition: Home  Discharge Instructions    Call MD for:  difficulty breathing, headache or visual disturbances    Complete by:  As directed    Call MD for:  extreme fatigue    Complete by:  As directed    Call MD for:  hives    Complete by:  As directed    Call MD for:  persistant dizziness or light-headedness    Complete by:  As directed    Call MD for:  persistant nausea and vomiting    Complete by:  As directed    Call MD for:  redness, tenderness, or signs of infection (pain, swelling, redness, odor or green/yellow discharge around incision site)    Complete by:  As directed     Call MD for:  severe uncontrolled pain    Complete by:  As directed    Call MD for:  temperature >100.4    Complete by:  As directed    Diet - low sodium heart healthy    Complete by:  As directed    Discharge instructions    Complete by:  As directed    Call [phone] for a followup appointment. Take a stool softener while you are using pain medications.   Driving Restrictions    Complete by:  As directed    Do not drive for 2 weeks.   Increase activity slowly    Complete by:  As directed    Lifting restrictions    Complete by:  As directed    Do not lift more than 5 pounds. No excessive bending or twisting.   May shower / Bathe    Complete by:  As directed    He may shower after the pain she is removed 3 days after surgery. Leave the incision alone.   Remove dressing in 48 hours    Complete by:  As directed    Your stitches are under the scan and will dissolve by themselves. The Steri-Strips will fall off after you take a few showers. Do not rub back or pick at the wound, Leave the wound alone.     Allergies as of 10/29/2016      Reactions   Pilocarpine Hcl Other (See Comments)   UNSPECIFIED MECHANISM Corneal lesions      Medication List    STOP taking these medications  oxyCODONE-acetaminophen 5-325 MG tablet Commonly known as:  PERCOCET     TAKE these medications   amLODipine-benazepril 10-20 MG capsule Commonly known as:  LOTREL Take 1 capsule by mouth daily.   aspirin EC 81 MG tablet Take 81 mg by mouth daily.   cephALEXin 500 MG capsule Commonly known as:  KEFLEX Take 1 capsule (500 mg total) by mouth every 6 (six) hours.   cetirizine 10 MG tablet Commonly known as:  ZYRTEC Take 10 mg by mouth daily.   citalopram 20 MG tablet Commonly known as:  CELEXA Take 20 mg by mouth daily.   cyclobenzaprine 10 MG tablet Commonly known as:  FLEXERIL Take 1 tablet (10 mg total) by mouth 3 (three) times daily as needed for muscle spasms.   docusate  sodium 100 MG capsule Commonly known as:  COLACE Take 1 capsule (100 mg total) by mouth 2 (two) times daily.   erythromycin ophthalmic ointment Commonly known as:  ROMYCIN Use a small amount on your sutures 4 times a day for the next 2 weeks. Switch to Aquaphor ointment should allergy develop.   ezetimibe-simvastatin 10-20 MG tablet Commonly known as:  VYTORIN Take 1 tablet by mouth daily at 6 PM.   fluticasone 50 MCG/ACT nasal spray Commonly known as:  FLONASE Place 2 sprays into both nostrils daily as needed for allergies.   gabapentin 300 MG capsule Commonly known as:  NEURONTIN Take 2 capsules (600 mg total) by mouth 2 (two) times daily.   glimepiride 4 MG tablet Commonly known as:  AMARYL Take 4 mg by mouth daily with breakfast.   hydrochlorothiazide 25 MG tablet Commonly known as:  HYDRODIURIL Take 25 mg by mouth every evening.   ibuprofen 200 MG tablet Commonly known as:  ADVIL,MOTRIN Take 400 mg by mouth every 6 (six) hours as needed for headache or moderate pain.   insulin glargine 100 UNIT/ML injection Commonly known as:  LANTUS Inject 70 Units into the skin at bedtime.   latanoprost 0.005 % ophthalmic solution Commonly known as:  XALATAN Place 1 drop into both eyes at bedtime.   metFORMIN 1000 MG tablet Commonly known as:  GLUCOPHAGE Take 1,000 mg by mouth 2 (two) times daily with a meal.   multivitamin tablet Take 1 tablet by mouth daily.   Oxycodone HCl 10 MG Tabs Take 1 tablet (10 mg total) by mouth every 3 (three) hours as needed for moderate pain. What changed:  Another medication with the same name was added. Make sure you understand how and when to take each.   oxyCODONE 5 MG immediate release tablet Commonly known as:  Oxy IR/ROXICODONE Take 1-2 tablets (5-10 mg total) by mouth every 3 (three) hours as needed for breakthrough pain. What changed:  You were already taking a medication with the same name, and this prescription was added. Make sure  you understand how and when to take each.   sitaGLIPtin 100 MG tablet Commonly known as:  JANUVIA Take 100 mg by mouth daily.   tamsulosin 0.4 MG Caps capsule Commonly known as:  FLOMAX Take 0.4 mg by mouth daily.   timolol 0.5 % ophthalmic solution Commonly known as:  TIMOPTIC Place 1 drop into both eyes every evening.   triamcinolone cream 0.1 % Commonly known as:  KENALOG Apply 1 application topically as needed (for rash).   VITAMIN C PO Take 1,000 mg by mouth 2 (two) times daily.        SignedNewman Pies D 10/29/2016, 9:24 AM

## 2016-10-30 ENCOUNTER — Emergency Department
Admission: EM | Admit: 2016-10-30 | Discharge: 2016-10-30 | Disposition: A | Discharge status: Home / Self Care | Payer: Medicare Other | Attending: Student in an Organized Health Care Education/Training Program | Admitting: Student in an Organized Health Care Education/Training Program

## 2016-10-30 DIAGNOSIS — Z7982 Long term (current) use of aspirin: Secondary | ICD-10-CM | POA: Diagnosis not present

## 2016-10-30 DIAGNOSIS — T8189XA Other complications of procedures, not elsewhere classified, initial encounter: Secondary | ICD-10-CM | POA: Diagnosis not present

## 2016-10-30 DIAGNOSIS — Z79899 Other long term (current) drug therapy: Secondary | ICD-10-CM | POA: Diagnosis not present

## 2016-10-30 DIAGNOSIS — E119 Type 2 diabetes mellitus without complications: Secondary | ICD-10-CM | POA: Insufficient documentation

## 2016-10-30 DIAGNOSIS — I10 Essential (primary) hypertension: Secondary | ICD-10-CM | POA: Diagnosis not present

## 2016-10-30 DIAGNOSIS — Z794 Long term (current) use of insulin: Secondary | ICD-10-CM | POA: Insufficient documentation

## 2016-10-30 DIAGNOSIS — G8918 Other acute postprocedural pain: Secondary | ICD-10-CM | POA: Diagnosis not present

## 2016-10-30 DIAGNOSIS — Z87891 Personal history of nicotine dependence: Secondary | ICD-10-CM | POA: Insufficient documentation

## 2016-10-30 DIAGNOSIS — Y829 Unspecified medical devices associated with adverse incidents: Secondary | ICD-10-CM | POA: Diagnosis not present

## 2016-10-30 DIAGNOSIS — Z5189 Encounter for other specified aftercare: Secondary | ICD-10-CM | POA: Diagnosis not present

## 2016-10-30 LAB — CBC WITH DIFFERENTIAL/PLATELET
BASOS ABS: 0 10*3/uL (ref 0–0.1)
Basophils Relative: 0 %
Eosinophils Absolute: 0.1 10*3/uL (ref 0–0.7)
Eosinophils Relative: 1 %
HEMATOCRIT: 37.4 % — AB (ref 40.0–52.0)
Hemoglobin: 13.4 g/dL (ref 13.0–18.0)
LYMPHS ABS: 1 10*3/uL (ref 1.0–3.6)
LYMPHS PCT: 12 %
MCH: 33.6 pg (ref 26.0–34.0)
MCHC: 35.7 g/dL (ref 32.0–36.0)
MCV: 94 fL (ref 80.0–100.0)
MONO ABS: 1.3 10*3/uL — AB (ref 0.2–1.0)
Monocytes Relative: 15 %
NEUTROS ABS: 6.2 10*3/uL (ref 1.4–6.5)
Neutrophils Relative %: 72 %
Platelets: 127 10*3/uL — ABNORMAL LOW (ref 150–440)
RBC: 3.98 MIL/uL — ABNORMAL LOW (ref 4.40–5.90)
RDW: 13.5 % (ref 11.5–14.5)
WBC: 8.6 10*3/uL (ref 3.8–10.6)

## 2016-10-30 LAB — BASIC METABOLIC PANEL
ANION GAP: 10 (ref 5–15)
BUN: 13 mg/dL (ref 6–20)
CALCIUM: 9.2 mg/dL (ref 8.9–10.3)
CO2: 28 mmol/L (ref 22–32)
Chloride: 96 mmol/L — ABNORMAL LOW (ref 101–111)
Creatinine, Ser: 0.71 mg/dL (ref 0.61–1.24)
GFR calc Af Amer: >60 mL/min (ref >60)
GFR calc non Af Amer: >60 mL/min (ref >60)
GLUCOSE: 214 mg/dL — AB (ref 65–99)
POTASSIUM: 3.5 mmol/L (ref 3.5–5.1)
Sodium: 134 mmol/L — ABNORMAL LOW (ref 135–145)

## 2016-10-30 MED ORDER — OXYCODONE-ACETAMINOPHEN 5-325 MG PO TABS
2.0000 | ORAL_TABLET | Freq: Once | ORAL | Status: AC
  Administered 2016-10-30: 2 via ORAL
  Filled 2016-10-30: qty 2

## 2016-10-30 NOTE — ED Provider Notes (Signed)
[facility] Emergency Department Provider Note    First MD Initiated Contact with Patient 10/30/16 [phone]     (approximate)  I have reviewed the triage vital signs and the nursing notes.   HISTORY  Chief Complaint Post-op Problem    HPI Miguel White is a 71 y.o. male presents one day status post decompressive laminectomy for neurogenic claudication presents with bleeding from the surgical wound. Yesterday the patient had the wound packed and had hemostasis obtained prior to discharge but went home and last night had woken up to the bathroom and blood had soaked his clothes and was on the floor. Comes to the ER in no acute distress. Does have some mild low back pain which be appropriate in the postop setting. Denies any chest pain shortness of breath or lightheadedness. He's not on any blood thinners. No family history of bleeding disorders.   Past Medical History:  Diagnosis Date  . Allergic rhinitis   . DDD (degenerative disc disease), cervical   . Depressive disorder   . Diabetes mellitus without complication ([place])   . Diabetic peripheral neuropathy ([place])   . Glaucoma   . History of chickenpox   . History of claustrophobia   . HNP (herniated nucleus pulposus with myelopathy), thoracic   . Hyperlipidemia   . Hypertension   . Hypertrophy of prostate without urinary obstruction   . Lumbar radiculitis   . Lumbar stenosis   . Motion sickness    back seat of cars  . Obesity   . OSA on CPAP   . Shingles    Family History  Problem Relation Age of Onset  . Pancreatic cancer Mother   . Liver cancer Father    Past Surgical History:  Procedure Laterality Date  . BROW LIFT Bilateral 05/26/2016   Procedure: BLEPHAROPLASTY UPPER EYELID W/EXCESS SKIN;  Surgeon: Karle Starch, MD;  Location: [place];  Service: Ophthalmology;  Laterality: Bilateral;  . CERVICAL FUSION  2012  . COLONOSCOPY WITH PROPOFOL N/A 07/01/2015   Procedure: COLONOSCOPY  WITH PROPOFOL;  Surgeon: Manya Silvas, MD;  Location: [facility] ENDOSCOPY;  Service: Endoscopy;  Laterality: N/A;  . DECOMPRESSIVE LUMBAR LAMINECTOMY LEVEL 4  01/20/2016   [facility], Dr. Orinda Kenner  . LUMBAR LAMINECTOMY/DECOMPRESSION MICRODISCECTOMY Bilateral 10/28/2016   Procedure: LAMINECTOMY BILATERAL LUMBAR 4- LUMBAR 5;  Surgeon: Newman Pies, MD;  Location: [place];  Service: Neurosurgery;  Laterality: Bilateral;  LAMINECTOMY FOR FACET/SYNOVIAL CYST BILATERAL LUMBAR 4- LUMBAR 5  . POSTERIOR CERVICAL FUSION/FORAMINOTOMY  2012  . PTOSIS REPAIR Bilateral 05/26/2016   Procedure: PTOSIS REPAIR RESECT EX;  Surgeon: Karle Starch, MD;  Location: [place];  Service: Ophthalmology;  Laterality: Bilateral;  Diabetic - insulin and oral meds sleep apnea  . TONSILLECTOMY     Patient Active Problem List   Diagnosis Date Noted  . Lumbar stenosis with neurogenic claudication 10/28/2016  . Fever 01/23/2016  . Spondylolisthesis of lumbar region 01/20/2016      Prior to Admission medications   Medication Sig Start Date End Date Taking? Authorizing Provider  amLODipine-benazepril (LOTREL) 10-20 MG capsule Take 1 capsule by mouth daily.     [provider]  Ascorbic Acid (VITAMIN C PO) Take 1,000 mg by mouth 2 (two) times daily.     [provider]  aspirin EC 81 MG tablet Take 81 mg by mouth daily.    [provider]  cephALEXin (KEFLEX) 500 MG capsule Take 1 capsule (500 mg total) by  mouth every 6 (six) hours. 10/29/16   Newman Pies, MD  cetirizine (ZYRTEC) 10 MG tablet Take 10 mg by mouth daily.     [provider]  citalopram (CELEXA) 20 MG tablet Take 20 mg by mouth daily.    [provider]  cyclobenzaprine (FLEXERIL) 10 MG tablet Take 1 tablet (10 mg total) by mouth 3 (three) times daily as needed for muscle spasms. 10/29/16   Newman Pies, MD  docusate sodium (COLACE) 100 MG capsule Take 1 capsule (100 mg total) by mouth 2 (two) times  daily. 10/29/16   Newman Pies, MD  erythromycin [facility]) ophthalmic ointment Use a small amount on your sutures 4 times a day for the next 2 weeks. Switch to Aquaphor ointment should allergy develop. Patient not taking: Reported on 10/19/2016 05/26/16   Karle Starch, MD  ezetimibe-simvastatin (VYTORIN) 10-20 MG tablet Take 1 tablet by mouth daily at 6 PM.     [provider]  fluticasone (FLONASE) 50 MCG/ACT nasal spray Place 2 sprays into both nostrils daily as needed for allergies.     [provider]  gabapentin (NEURONTIN) 300 MG capsule Take 2 capsules (600 mg total) by mouth 2 (two) times daily. 01/24/16   Newman Pies, MD  glimepiride (AMARYL) 4 MG tablet Take 4 mg by mouth daily with breakfast.    [provider]  hydrochlorothiazide (HYDRODIURIL) 25 MG tablet Take 25 mg by mouth every evening.     [provider]  ibuprofen (ADVIL,MOTRIN) 200 MG tablet Take 400 mg by mouth every 6 (six) hours as needed for headache or moderate pain.    [provider]  insulin glargine (LANTUS) 100 UNIT/ML injection Inject 70 Units into the skin at bedtime.     [provider]  latanoprost (XALATAN) 0.005 % ophthalmic solution Place 1 drop into both eyes at bedtime.     [provider]  metFORMIN (GLUCOPHAGE) 1000 MG tablet Take 1,000 mg by mouth 2 (two) times daily with a meal.    [provider]  Multiple Vitamin (MULTIVITAMIN) tablet Take 1 tablet by mouth daily.    [provider]  oxyCODONE (OXY IR/ROXICODONE) 5 MG immediate release tablet Take 1-2 tablets (5-10 mg total) by mouth every 3 (three) hours as needed for breakthrough pain. 10/29/16   Newman Pies, MD  oxyCODONE 10 MG TABS Take 1 tablet (10 mg total) by mouth every 3 (three) hours as needed for moderate pain. Patient not taking: Reported on 05/26/2016 01/24/16   Newman Pies, MD  sitaGLIPtin (JANUVIA) 100 MG tablet Take 100 mg by mouth daily.     [provider]  tamsulosin (FLOMAX) 0.4 MG CAPS capsule Take 0.4 mg by mouth daily.    [provider]  timolol (TIMOPTIC) 0.5 % ophthalmic solution Place 1 drop into both eyes every evening.     [provider]  triamcinolone cream (KENALOG) 0.1 % Apply 1 application topically as needed (for rash).     [provider]    Allergies Pilocarpine hcl    Social History Social History  Substance Use Topics  . Smoking status: Former Smoker    Quit date: 01/07/1984  . Smokeless tobacco: Never Used  . Alcohol use 8.4 oz/week    14 Glasses of wine per week     Comment:      Review of Systems Patient denies headaches, rhinorrhea, blurry vision, numbness, shortness of breath, chest pain, edema, cough, abdominal pain, nausea, vomiting, diarrhea, dysuria, fevers, rashes  or hallucinations unless otherwise stated above in HPI. ____________________________________________   PHYSICAL EXAM:  VITAL SIGNS: Vitals:   10/30/16 0730 10/30/16 0800  BP: (!) 158/81 136/70  Pulse: 92 91  Resp: 11 12  Temp:    SpO2: 94% 92%    Constitutional: Alert and oriented. Well appearing and in no acute distress. Eyes: Conjunctivae are normal.  Head: Atraumatic. Nose: No congestion/rhinnorhea. Mouth/Throat: Mucous membranes are moist.   Neck: No stridor. Painless ROM.  Cardiovascular: Normal rate, regular rhythm. Grossly normal heart sounds.  Good peripheral circulation. Respiratory: Normal respiratory effort.  No retractions. Lungs CTAB. Gastrointestinal: Soft and nontender. No distention. No abdominal bruits. No CVA tenderness. Musculoskeletal: No lower extremity tenderness nor edema.  No joint effusions. Neurologic:  Normal speech and language. No gross focal neurologic deficits are appreciated. No facial droop Skin:  swelling to posterior midline lumbar spine with postoperative incision intact with oozing blood from the most cephalad and caudal portions of the  incision. No evidence of active hemorrhage. No evidence of cellulitis. No blistering. No evidence of dehiscence. Psychiatric: Mood and affect are normal. Speech and behavior are normal.  ____________________________________________   LABS (all labs ordered are listed, but only abnormal results are displayed)  Results for orders placed or performed during the hospital encounter of 10/30/16 (from the past 24 hour(s))  CBC with Differential/Platelet     Status: Abnormal   Collection Time: 10/30/16  7:19 AM  Result Value Ref Range   WBC 8.6 3.8 - 10.6 K/uL   RBC 3.98 (L) 4.40 - 5.90 MIL/uL   Hemoglobin 13.4 13.0 - 18.0 g/dL   HCT 37.4 (L) 40.0 - 52.0 %   MCV 94.0 80.0 - 100.0 fL   MCH 33.6 26.0 - 34.0 pg   MCHC 35.7 32.0 - 36.0 g/dL   RDW 13.5 11.5 - 14.5 %   Platelets 127 (L) 150 - 440 K/uL   Neutrophils Relative % 72 %   Neutro Abs 6.2 1.4 - 6.5 K/uL   Lymphocytes Relative 12 %   Lymphs Abs 1.0 1.0 - 3.6 K/uL   Monocytes Relative 15 %   Monocytes Absolute 1.3 (H) 0.2 - 1.0 K/uL   Eosinophils Relative 1 %   Eosinophils Absolute 0.1 0 - 0.7 K/uL   Basophils Relative 0 %   Basophils Absolute 0.0 0 - 0.1 K/uL  Basic metabolic panel     Status: Abnormal   Collection Time: 10/30/16  7:19 AM  Result Value Ref Range   Sodium 134 (L) 135 - 145 mmol/L   Potassium 3.5 3.5 - 5.1 mmol/L   Chloride 96 (L) 101 - 111 mmol/L   CO2 28 22 - 32 mmol/L   Glucose, Bld 214 (H) 65 - 99 mg/dL   BUN 13 6 - 20 mg/dL   Creatinine, Ser 0.71 0.61 - 1.24 mg/dL   Calcium 9.2 8.9 - 10.3 mg/dL   GFR calc non Af Amer >60 >60 mL/min   GFR calc Af Amer >60 >60 mL/min   Anion gap 10 5 - 15   ____________________________________________  EKG____________________________________________  RADIOLOGY   ____________________________________________   PROCEDURES  Procedure(s) performed:  Procedures    Critical Care performed: no ____________________________________________   INITIAL IMPRESSION /  ASSESSMENT AND PLAN / ED COURSE  Pertinent labs & imaging results that were available during my care of the patient were reviewed by me and considered in my medical decision making (see chart for details).  DDX: wound dehiscence, hematoma,   Vidal Schwalbe  is a 71 y.o. who presents to the ED with bleeding from lumbar laminectomy surgical site status post surgery yesterday. He is afebrile hemodynamically stable. No evidence of active hemorrhage at the site at this time. Blood work is reassuring. Patient in no acute distress. No evidence of dehiscence. Probable leaking hematoma status post surgery. Patient with no focal neurodeficits. Spoke with on-call neurosurgeon, Dr. Christella Noa recommends patient come to clinic today for reevaluation. This finding with patient is stable and appropriate for further workup as an outpatient.  Have discussed with the patient and available family all diagnostics and treatments performed thus far and all questions were answered to the best of my ability. The patient demonstrates understanding and agreement with plan.       ____________________________________________   FINAL CLINICAL IMPRESSION(S) / ED DIAGNOSES  Final diagnoses:  Post-operative pain  Visit for wound check      NEW MEDICATIONS STARTED DURING THIS VISIT:  New Prescriptions   No medications on file     Note:  This document was prepared using Dragon voice recognition software and may include unintentional dictation errors.    Merlyn Lot, MD 10/30/16 [phone]

## 2016-10-30 NOTE — ED Triage Notes (Signed)
Patient had laminectomy yesterday, had some post-op bleeding at the time and wound was packed prior to being discharged from facility [facility]). Patient reports incision began to bleed again tonight.

## 2016-10-30 NOTE — Discharge Instructions (Signed)
please contact Dr. Arnoldo Morale office for same-day appointment. Return for worsening pain, fever it's lightheadedness or persistent bleeding.

## 2016-11-02 LAB — AEROBIC/ANAEROBIC CULTURE (SURGICAL/DEEP WOUND)

## 2016-11-02 LAB — AEROBIC/ANAEROBIC CULTURE W GRAM STAIN (SURGICAL/DEEP WOUND): Culture: NO GROWTH

## 2018-11-07 IMAGING — CR DG LUMBAR SPINE COMPLETE 4+V
5 series · 5 of 5 positions shown · non-contrast
Comparison: 01/20/2016

CLINICAL DATA: Fall this morning.  Lumbar surgery 4 days ago.

EXAM:
LUMBAR SPINE - COMPLETE 4+ VIEW

[l-spine ap]
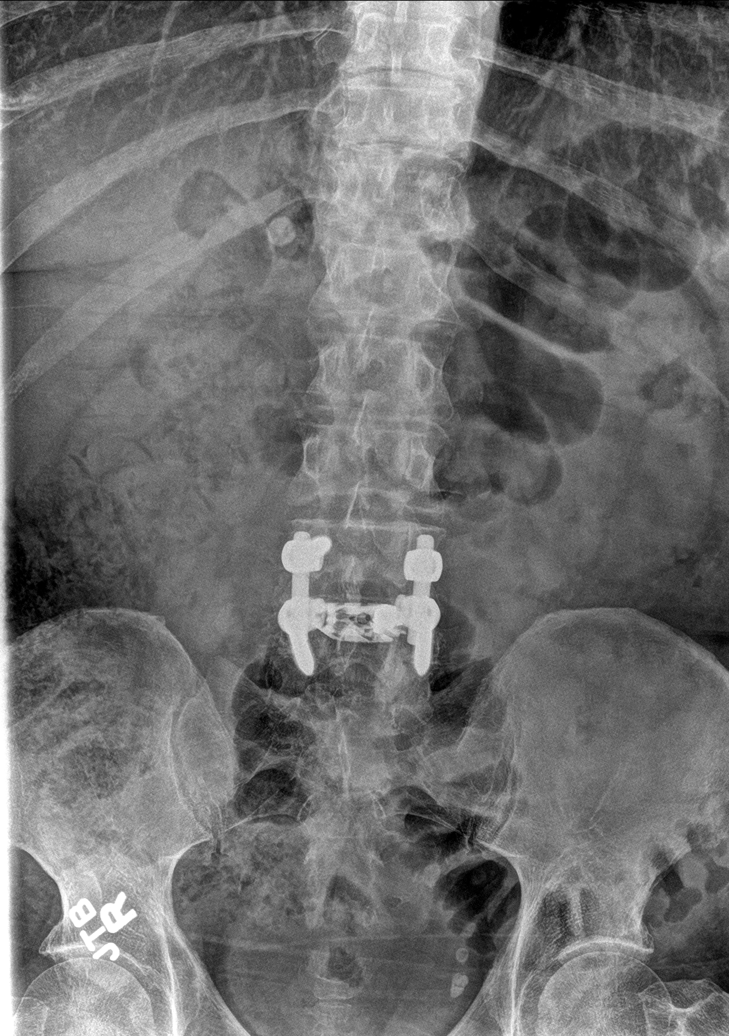

[l-spine obl (1 of 2)]
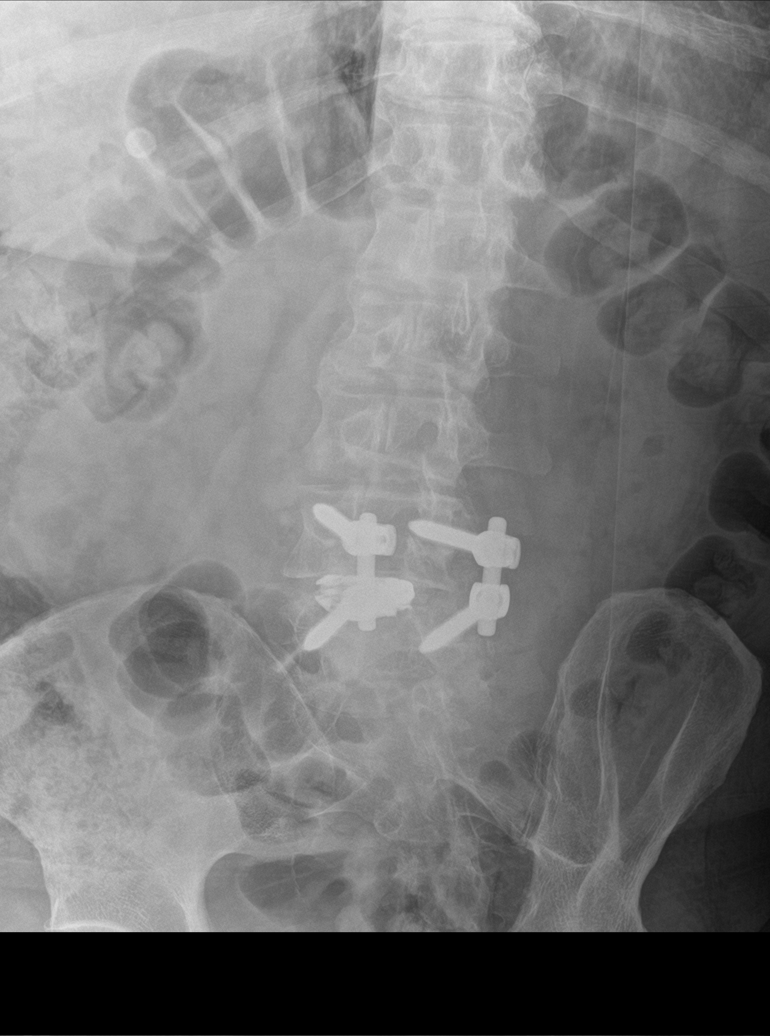

[l-spine obl (2 of 2)]
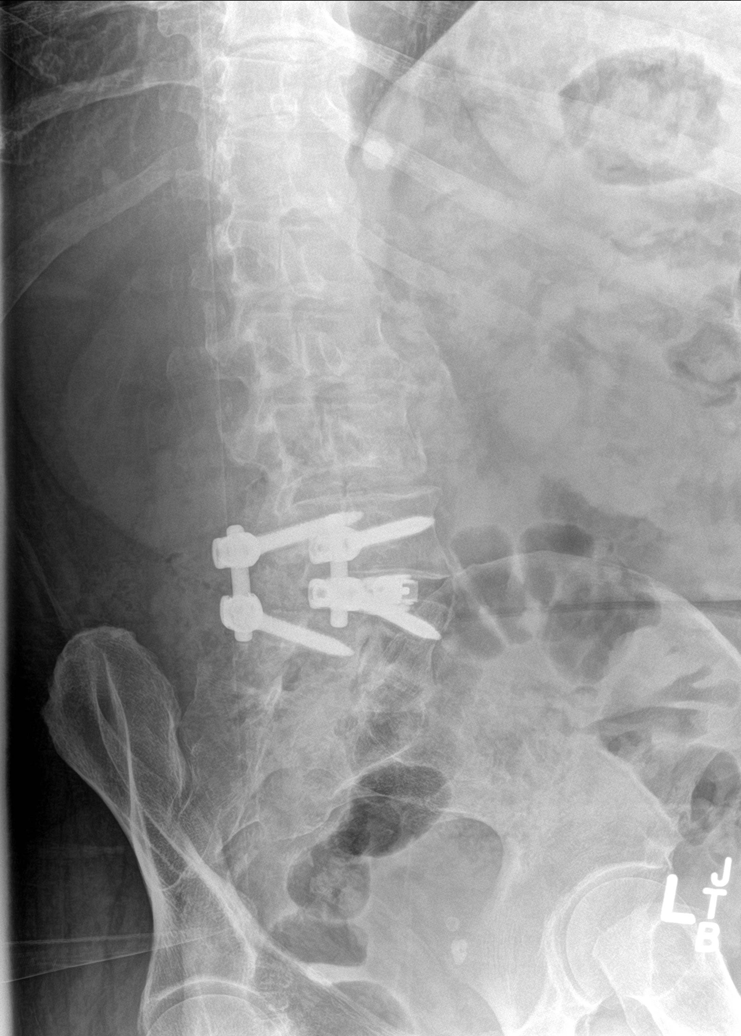

[l-spine lat]
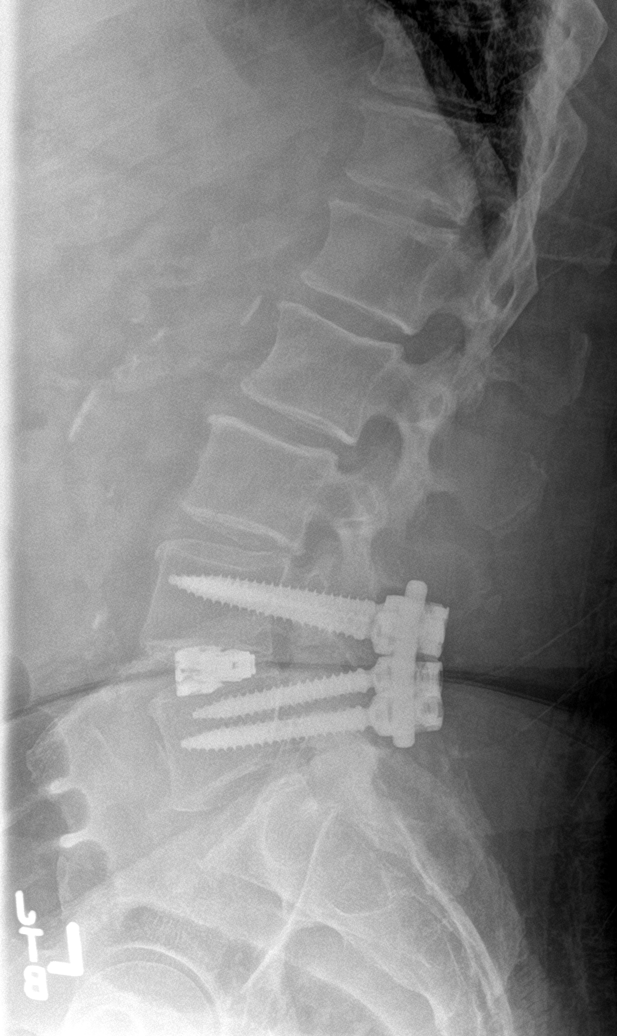

[l-spine spot]
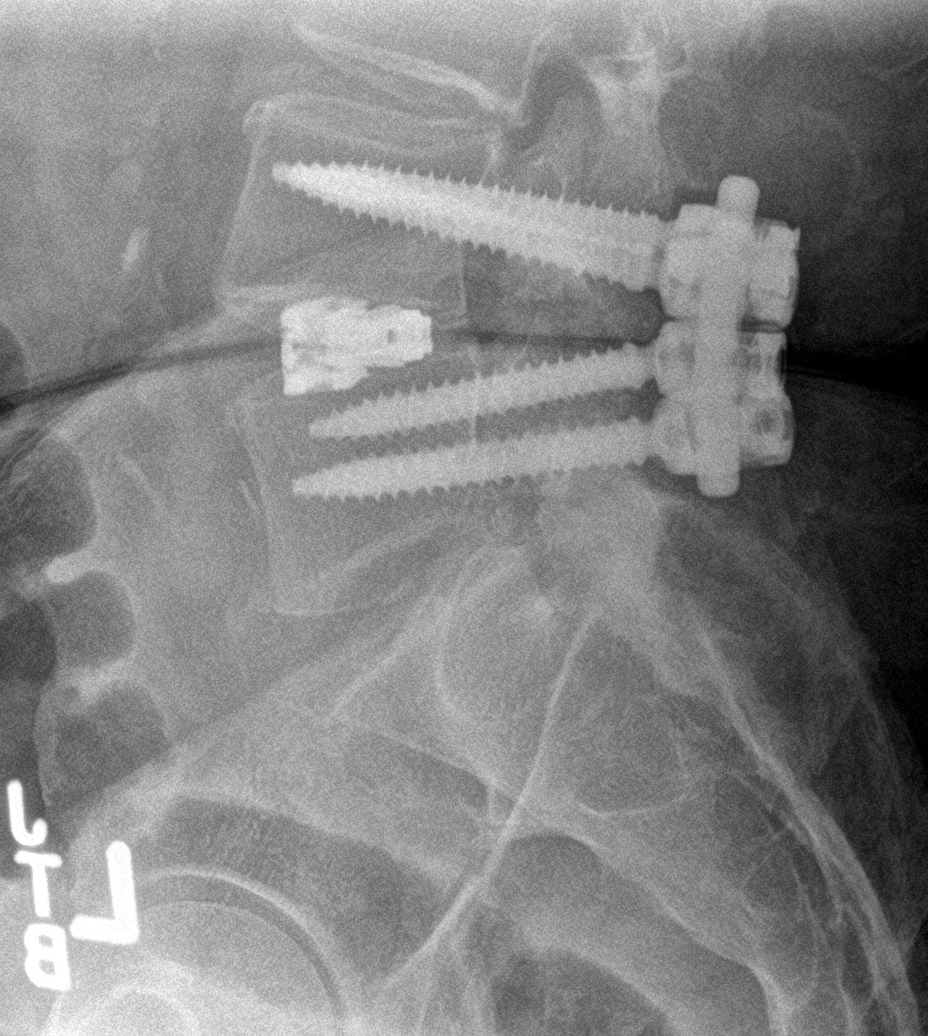

[5 of 5 positions shown; findings below may reference images not displayed]

FINDINGS: Posterolateral rod and pedicle screw fixation at the L4-5 level
bilaterally with interbody prosthesis. No new malalignment or new
fracture seen, there was pre-existing mild grade 1 retrolisthesis at
L2- 3 and anterolisthesis at L4-5. Right upper quadrant
calcification likely a gallstone.

Aortoiliac atherosclerotic vascular disease.
IMPRESSION: 1. No acute bony findings are identified. No complicating feature
related to the hardware and prosthetic at the L4-5 level.
2.  Aortoiliac atherosclerotic vascular disease.

## 2019-02-06 ENCOUNTER — Ambulatory Visit: Payer: Medicare PPO | Attending: Internal Medicine

## 2019-02-06 DIAGNOSIS — Z23 Encounter for immunization: Secondary | ICD-10-CM | POA: Insufficient documentation

## 2019-02-06 NOTE — Progress Notes (Signed)
   Covid-19 Vaccination Clinic  Name:  Miguel White    MRN: [id number] DOB: Feb 08, 1945  02/06/2019  Mr. Miguel White was observed post Covid-19 immunization for 15 minutes without incidence. He was provided with Vaccine Information Sheet and instruction to access the V-Safe system.   Mr. Miguel White was instructed to call 911 with any severe reactions post vaccine: [name] Difficulty breathing  . Swelling of your face and throat  . A fast heartbeat  . A bad rash all over your body  . Dizziness and weakness    Immunizations Administered    Name Date Dose VIS Date Route   Pfizer COVID-19 Vaccine 02/06/2019  1:15 PM 0.3 mL 12/23/2018 Intramuscular   Manufacturer: [place]   Lot: [id number]   [place]: [id number]

## 2019-02-27 ENCOUNTER — Ambulatory Visit: Payer: Medicare PPO | Attending: Internal Medicine

## 2019-02-27 DIAGNOSIS — Z23 Encounter for immunization: Secondary | ICD-10-CM | POA: Insufficient documentation

## 2019-02-27 NOTE — Progress Notes (Signed)
   Covid-19 Vaccination Clinic  Name:  Miguel White    MRN: [id number] DOB: 1945/08/09  02/27/2019  Miguel White was observed post Covid-19 immunization for 15 minutes without incidence. He was provided with Vaccine Information Sheet and instruction to access the V-Safe system.   Miguel White was instructed to call 911 with any severe reactions post vaccine: [name] Difficulty breathing  . Swelling of your face and throat  . A fast heartbeat  . A bad rash all over your body  . Dizziness and weakness    Immunizations Administered    Name Date Dose VIS Date Route   Pfizer COVID-19 Vaccine 02/27/2019 12:15 PM 0.3 mL 12/23/2018 Intramuscular   Manufacturer: [place]   Lot: [id number]   [place]: [id number]

## 2019-12-19 ENCOUNTER — Other Ambulatory Visit: Payer: Self-pay

## 2019-12-19 ENCOUNTER — Encounter: Payer: Self-pay | Admitting: Ophthalmology

## 2019-12-21 ENCOUNTER — Other Ambulatory Visit
Admission: RE | Admit: 2019-12-21 | Discharge: 2019-12-21 | Disposition: A | Discharge status: Home / Self Care | Payer: Medicare PPO | Source: Ambulatory Visit | Attending: Ophthalmology | Admitting: Ophthalmology

## 2019-12-21 ENCOUNTER — Other Ambulatory Visit: Payer: Self-pay

## 2019-12-21 DIAGNOSIS — Z01812 Encounter for preprocedural laboratory examination: Secondary | ICD-10-CM | POA: Insufficient documentation

## 2019-12-21 DIAGNOSIS — Z20822 Contact with and (suspected) exposure to covid-19: Secondary | ICD-10-CM | POA: Insufficient documentation

## 2019-12-21 NOTE — Discharge Instructions (Signed)

## 2019-12-22 LAB — SARS CORONAVIRUS 2 (TAT 6-24 HRS): SARS Coronavirus 2: NEGATIVE

## 2019-12-25 ENCOUNTER — Encounter
Admission: RE | Disposition: A | Discharge status: Home / Self Care | Payer: Self-pay | Source: Home / Self Care | Attending: Ophthalmology

## 2019-12-25 ENCOUNTER — Ambulatory Visit: Payer: Medicare PPO | Admitting: Anesthesiology

## 2019-12-25 ENCOUNTER — Other Ambulatory Visit: Payer: Self-pay

## 2019-12-25 ENCOUNTER — Ambulatory Visit
Admission: RE | Admit: 2019-12-25 | Discharge: 2019-12-25 | Disposition: A | Discharge status: Home / Self Care | Payer: Medicare PPO | Attending: Ophthalmology | Admitting: Ophthalmology

## 2019-12-25 ENCOUNTER — Encounter: Payer: Self-pay | Admitting: Ophthalmology

## 2019-12-25 DIAGNOSIS — H2511 Age-related nuclear cataract, right eye: Secondary | ICD-10-CM | POA: Diagnosis present

## 2019-12-25 DIAGNOSIS — Z888 Allergy status to other drugs, medicaments and biological substances status: Secondary | ICD-10-CM | POA: Insufficient documentation

## 2019-12-25 DIAGNOSIS — H401111 Primary open-angle glaucoma, right eye, mild stage: Secondary | ICD-10-CM | POA: Diagnosis not present

## 2019-12-25 DIAGNOSIS — Z79899 Other long term (current) drug therapy: Secondary | ICD-10-CM | POA: Insufficient documentation

## 2019-12-25 DIAGNOSIS — Z87891 Personal history of nicotine dependence: Secondary | ICD-10-CM | POA: Insufficient documentation

## 2019-12-25 DIAGNOSIS — Z7984 Long term (current) use of oral hypoglycemic drugs: Secondary | ICD-10-CM | POA: Insufficient documentation

## 2019-12-25 DIAGNOSIS — E1136 Type 2 diabetes mellitus with diabetic cataract: Secondary | ICD-10-CM | POA: Insufficient documentation

## 2019-12-25 HISTORY — PX: CATARACT EXTRACTION W/PHACO: SHX586

## 2019-12-25 LAB — GLUCOSE, CAPILLARY
Glucose-Capillary: 233 mg/dL — ABNORMAL HIGH (ref 70–99)
Glucose-Capillary: 191 mg/dL — ABNORMAL HIGH (ref 70–99)

## 2019-12-25 SURGERY — PHACOEMULSIFICATION, CATARACT, WITH IOL INSERTION
Anesthesia: Monitor Anesthesia Care | Site: Eye | Laterality: Right

## 2019-12-25 MED ORDER — EPINEPHRINE PF 1 MG/ML IJ SOLN
INTRAOCULAR | Status: DC | PRN
  Administered 2019-12-25: 11:00:00 94 mL via OPHTHALMIC

## 2019-12-25 MED ORDER — ACETAMINOPHEN 325 MG PO TABS: 325.0000 mg | ORAL_TABLET | ORAL | Status: DC | PRN

## 2019-12-25 MED ORDER — MOXIFLOXACIN HCL 0.5 % OP SOLN
OPHTHALMIC | Status: DC | PRN
  Administered 2019-12-25: 0.2 mL via OPHTHALMIC

## 2019-12-25 MED ORDER — FENTANYL CITRATE (PF) 100 MCG/2ML IJ SOLN
INTRAMUSCULAR | Status: DC | PRN
  Administered 2019-12-25: 50 ug via INTRAVENOUS

## 2019-12-25 MED ORDER — SODIUM HYALURONATE 10 MG/ML IO SOLN
INTRAOCULAR | Status: DC | PRN
  Administered 2019-12-25: 0.55 mL via INTRAOCULAR

## 2019-12-25 MED ORDER — TETRACAINE HCL 0.5 % OP SOLN
1.0000 [drp] | OPHTHALMIC | Status: DC | PRN
  Administered 2019-12-25 (×3): 1 [drp] via OPHTHALMIC

## 2019-12-25 MED ORDER — LIDOCAINE HCL (PF) 2 % IJ SOLN
INTRAOCULAR | Status: DC | PRN
  Administered 2019-12-25: 11:00:00 1 mL via INTRAOCULAR

## 2019-12-25 MED ORDER — MIDAZOLAM HCL 2 MG/2ML IJ SOLN
INTRAMUSCULAR | Status: DC | PRN
  Administered 2019-12-25: 2 mg via INTRAVENOUS

## 2019-12-25 MED ORDER — ARMC OPHTHALMIC DILATING DROPS
1.0000 "application " | OPHTHALMIC | Status: DC | PRN
  Administered 2019-12-25 (×3): 1 via OPHTHALMIC

## 2019-12-25 MED ORDER — LACTATED RINGERS IV SOLN: INTRAVENOUS | Status: DC

## 2019-12-25 MED ORDER — SODIUM HYALURONATE 23 MG/ML IO SOLN
INTRAOCULAR | Status: DC | PRN
  Administered 2019-12-25: 0.6 mL via INTRAOCULAR

## 2019-12-25 MED ORDER — ACETAMINOPHEN 160 MG/5ML PO SOLN: 325.0000 mg | ORAL | Status: DC | PRN

## 2019-12-25 SURGICAL SUPPLY — 23 items
CANNULA ANT/CHMB 27G (MISCELLANEOUS) ×2 IMPLANT
CANNULA ANT/CHMB 27GA (MISCELLANEOUS) ×6 IMPLANT
DEVICE INJECT ISTENT W (Stent) IMPLANT
DISSECTOR HYDRO NUCLEUS 50X22 (MISCELLANEOUS) ×3 IMPLANT
GLOVE SURG LX 7.5 STRW (GLOVE) ×4
GLOVE SURG LX STRL 7.5 STRW (GLOVE) ×1 IMPLANT
GLOVE SURG SYN 8.5  E (GLOVE) ×2
GLOVE SURG SYN 8.5 E (GLOVE) ×1 IMPLANT
GLOVE SURG SYN 8.5 PF PI (GLOVE) ×1 IMPLANT
GOWN STRL REUS W/ TWL LRG LVL3 (GOWN DISPOSABLE) ×2 IMPLANT
GOWN STRL REUS W/TWL LRG LVL3 (GOWN DISPOSABLE) ×6
ICLIP (OPHTHALMIC RELATED) ×2 IMPLANT
INJECT ISTENT W (Stent) ×3 IMPLANT
LENS IOL ACRSF IQ PAN 12.0 IMPLANT
LENS IOL IQ PANOPTIX 12.0 ×3 IMPLANT
MARKER SKIN DUAL TIP RULER LAB (MISCELLANEOUS) ×3 IMPLANT
PACK DR. KING ARMS (PACKS) ×3 IMPLANT
PACK EYE AFTER SURG (MISCELLANEOUS) ×3 IMPLANT
PACK OPTHALMIC (MISCELLANEOUS) ×3 IMPLANT
SYR 3ML LL SCALE MARK (SYRINGE) ×3 IMPLANT
SYR TB 1ML LUER SLIP (SYRINGE) ×3 IMPLANT
WATER STERILE IRR 250ML POUR (IV SOLUTION) ×3 IMPLANT
WIPE NON LINTING 3.25X3.25 (MISCELLANEOUS) ×3 IMPLANT

## 2019-12-25 NOTE — Anesthesia Postprocedure Evaluation (Signed)
Anesthesia Post Note  Patient: Miguel White  Procedure(s) Performed: CATARACT EXTRACTION PHACO AND INTRAOCULAR LENS PLACEMENT (IOC) RIGHT PANOPTIX ISTENT INJ DIABETIC (Right Eye)     Patient location during evaluation: PACU Anesthesia Type: MAC Level of consciousness: awake and alert Pain management: pain level controlled Vital Signs Assessment: post-procedure vital signs reviewed and stable Respiratory status: spontaneous breathing, nonlabored ventilation, respiratory function stable and patient connected to nasal cannula oxygen Cardiovascular status: stable and blood pressure returned to baseline Postop Assessment: no apparent nausea or vomiting Anesthetic complications: no   No complications documented.  Wanda Plump Ace Bergfeld

## 2019-12-25 NOTE — Transfer of Care (Signed)
Immediate Anesthesia Transfer of Care Note  Patient: Miguel White  Procedure(s) Performed: CATARACT EXTRACTION PHACO AND INTRAOCULAR LENS PLACEMENT (IOC) RIGHT PANOPTIX ISTENT INJ DIABETIC (Right Eye)  Patient Location: PACU  Anesthesia Type: MAC  Level of Consciousness: awake, alert  and patient cooperative  Airway and Oxygen Therapy: Patient Spontanous Breathing and Patient connected to supplemental oxygen  Post-op Assessment: Post-op Vital signs reviewed, Patient's Cardiovascular Status Stable, Respiratory Function Stable, Patent Airway and No signs of Nausea or vomiting  Post-op Vital Signs: Reviewed and stable  Complications: No complications documented.

## 2019-12-25 NOTE — Op Note (Signed)
OPERATIVE NOTE  Miguel White [id number] 12/25/2019  PREOPERATIVE DIAGNOSIS:   1.  Mild PRIMARY open angle glaucoma, right eye. H40.1111  2.  Nuclear sclerotic cataract right eye.  H25.11   POSTOPERATIVE DIAGNOSIS:    same.   PROCEDURE:   1.  Placement of trabecular bypass stent (istent). CPT 0191T  and placement of additional stent  CPT 0376T 2.  Phacoemusification with posterior chamber intraocular lens placement of the right eye  CPT [phone]   LENS: Implant Name Type Inv. Item Serial No. Manufacturer Lot No. LRB No. Used Action  ALCON ACRYSOF IQ PANOPTIX IOL Intraocular Lens  [id number] [facility]  Right 1 Implanted  Marko Stai - [id number] Stent Marko Stai [id number] [place] [id number] Right 1 Implanted      Procedure(s) with comments: CATARACT EXTRACTION PHACO AND INTRAOCULAR LENS PLACEMENT (IOC) RIGHT PANOPTIX ISTENT INJ DIABETIC (Right) - 4.01 0:45.4  TFNT00 +12.0  ULTRASOUND TIME: 0 minutes 45 seconds.  CDE 4.01   SURGEON:  Benay Pillow, MD, MPH  ANESTHESIOLOGIST: Anesthesiologist: Carlos American, MD CRNA: Silvana Newness, CRNA   ANESTHESIA:  MAC and intracameral preservative-free lidocaine 4%.  ESTIMATED BLOOD LOSS: less than 1 mL.   COMPLICATIONS:  None.   DESCRIPTION OF PROCEDURE:  The patient was identified in the holding room and transported to the operating room.   The patient was placed in the supine position under the operating microscope.  The right eye was prepped and draped in the usual sterile ophthalmic fashion.   A 1.0 millimeter clear-corneal paracentesis was made at the 10:30 position. 0.5 ml of preservative-free 1% lidocaine with epinephrine was injected into the anterior chamber.  The anterior chamber was filled with Healon 5 viscoelastic.  A 2.4 millimeter keratome was used to make a near-clear corneal incision at the 8:00 position.   Attention was turned to the istent.  The patients head was turned to the left and the  microscope was tilted to 035 degrees.  Ocular instruments/Glaukos OAL/H2 gonioprism was used with IPC05 (iclip) coupled with Healon 5 on the cornea was used to visualize the trabecular meshwork. The istent was opened and introduced into the eye.  The meshwork was engaged with the tip of the iStent injector and the stent was deployed into Schlemm's canal at 2:00.  The second stent was deployed at 4:00.  The stents were well seated and in good position.  Next, attention was turned to the phacoemulsification A curvilinear capsulorrhexis was made with a cystotome and capsulorrhexis forceps.  Balanced salt solution was used to hydrodissect and hydrodelineate the nucleus.   Phacoemulsification was then used in stop and chop fashion to remove the lens nucleus and epinucleus.  The remaining cortex was then removed using the irrigation and aspiration handpiece. Healon was then placed into the capsular bag to distend it for lens placement.  A lens was then injected into the capsular bag.  The remaining viscoelastic was aspirated.  The iris was floppy and relatively small but was managed with viscodilation without difficulty. The multifocal lens was well centered and clear. There was heme reflux from both istents, indicating good function and placement.   Wounds were hydrated with balanced salt solution.  The anterior chamber was inflated to a physiologic pressure with balanced salt solution.   Intracameral vigamox 0.1 mL undiluted was injected into the eye and a drop placed onto the ocular surface.  No wound leaks were noted. The patient was taken to the recovery room in stable condition without complications  of anesthesia or surgery   Benay Pillow 12/25/2019, 11:03 AM

## 2019-12-25 NOTE — Anesthesia Procedure Notes (Signed)
Procedure Name: MAC Date/Time: 12/25/2019 10:32 AM Performed by: Silvana Newness, CRNA Pre-anesthesia Checklist: Patient identified, Emergency Drugs available, Suction available, Patient being monitored and Timeout performed Patient Re-evaluated:Patient Re-evaluated prior to induction Oxygen Delivery Method: Nasal cannula Placement Confirmation: positive ETCO2

## 2019-12-25 NOTE — Anesthesia Preprocedure Evaluation (Signed)
Anesthesia Evaluation  Patient identified by MRN, date of birth, ID band Patient awake    Airway Mallampati: III  TM Distance: >3 FB Neck ROM: Full    Dental   Pulmonary sleep apnea , former smoker,    breath sounds clear to auscultation       Cardiovascular hypertension, Normal cardiovascular exam     Neuro/Psych PSYCHIATRIC DISORDERS Depression    GI/Hepatic   Endo/Other  diabetesMorbid obesity  Renal/GU      Musculoskeletal  (+) Arthritis ,   Abdominal   Peds  Hematology   Anesthesia Other Findings   Reproductive/Obstetrics                             Anesthesia Physical Anesthesia Plan  ASA: III  Anesthesia Plan: MAC   Post-op Pain Management:    Induction: Intravenous  PONV Risk Score and Plan:   Airway Management Planned: Natural Airway  Additional Equipment:   Intra-op Plan:   Post-operative Plan:   Informed Consent: I have reviewed the patients History and Physical, chart, labs and discussed the procedure including the risks, benefits and alternatives for the proposed anesthesia with the patient or authorized representative who has indicated his/her understanding and acceptance.       Plan Discussed with:   Anesthesia Plan Comments:         Anesthesia Quick Evaluation

## 2019-12-25 NOTE — H&P (Signed)
[facility]   Primary Care Physician:  Derinda Late, MD Ophthalmologist: Dr. Benay Pillow  Pre-Procedure History & Physical: HPI:  Miguel White is a 74 y.o. male here for cataract surgery.   Past Medical History:  Diagnosis Date  . Allergic rhinitis   . DDD (degenerative disc disease), cervical   . Depressive disorder   . Diabetes mellitus without complication ([place])   . Diabetic peripheral neuropathy ([place])   . Glaucoma   . History of chickenpox   . History of claustrophobia   . HNP (herniated nucleus pulposus with myelopathy), thoracic   . Hyperlipidemia   . Hypertension   . Hypertrophy of prostate without urinary obstruction   . Lumbar radiculitis   . Lumbar stenosis   . Motion sickness    back seat of cars  . Obesity   . OSA on CPAP   . Shingles     Past Surgical History:  Procedure Laterality Date  . BROW LIFT Bilateral 05/26/2016   Procedure: BLEPHAROPLASTY UPPER EYELID W/EXCESS SKIN;  Surgeon: Karle Starch, MD;  Location: [place];  Service: Ophthalmology;  Laterality: Bilateral;  . CERVICAL FUSION  2012  . COLONOSCOPY WITH PROPOFOL N/A 07/01/2015   Procedure: COLONOSCOPY WITH PROPOFOL;  Surgeon: Manya Silvas, MD;  Location: [facility] ENDOSCOPY;  Service: Endoscopy;  Laterality: N/A;  . DECOMPRESSIVE LUMBAR LAMINECTOMY LEVEL 4  01/20/2016   [facility], Dr. Orinda Kenner  . LUMBAR LAMINECTOMY/DECOMPRESSION MICRODISCECTOMY Bilateral 10/28/2016   Procedure: LAMINECTOMY BILATERAL LUMBAR 4- LUMBAR 5;  Surgeon: Newman Pies, MD;  Location: [place];  Service: Neurosurgery;  Laterality: Bilateral;  LAMINECTOMY FOR FACET/SYNOVIAL CYST BILATERAL LUMBAR 4- LUMBAR 5  . POSTERIOR CERVICAL FUSION/FORAMINOTOMY  2012  . PTOSIS REPAIR Bilateral 05/26/2016   Procedure: PTOSIS REPAIR RESECT EX;  Surgeon: Karle Starch, MD;  Location: [place];  Service: Ophthalmology;  Laterality: Bilateral;  Diabetic - insulin and oral meds sleep apnea  . TONSILLECTOMY       Prior to Admission medications   Medication Sig Start Date End Date Taking? Authorizing Provider  amLODipine-benazepril (LOTREL) 10-20 MG capsule Take 1 capsule by mouth daily.    Yes [provider]  Ascorbic Acid (VITAMIN C PO) Take 1,000 mg by mouth daily.    Yes [provider]  BIOTIN PO Take by mouth daily.   Yes [provider]  cetirizine (ZYRTEC) 10 MG tablet Take 10 mg by mouth daily.    Yes [provider]  citalopram (CELEXA) 20 MG tablet Take 20 mg by mouth daily.   Yes [provider]  ezetimibe-simvastatin (VYTORIN) 10-20 MG tablet Take 1 tablet by mouth daily at 6 PM.    Yes [provider]  fluticasone (FLONASE) 50 MCG/ACT nasal spray Place 2 sprays into both nostrils daily as needed for allergies.    Yes [provider]  glimepiride (AMARYL) 4 MG tablet Take 4 mg by mouth daily with breakfast.   Yes [provider]  hydrochlorothiazide (HYDRODIURIL) 25 MG tablet Take 25 mg by mouth every evening.    Yes [provider]  ibuprofen (ADVIL,MOTRIN) 200 MG tablet Take 400 mg by mouth every 6 (six) hours as needed for headache or moderate pain.   Yes [provider]  latanoprost (XALATAN) 0.005 % ophthalmic solution Place 1 drop into both eyes at bedtime.    Yes [provider]  Lifitegrast Shirley Friar) 5 % SOLN Apply to eye 2 (two) times daily.   Yes [provider]  metFORMIN (GLUCOPHAGE) 1000 MG tablet Take 1,000 mg by mouth 2 (two) times daily with a meal.   Yes [provider]  Multiple Vitamin (MULTIVITAMIN) tablet Take 1 tablet by mouth daily.   Yes [provider]  Omega-3 Fatty Acids (OMEGA-3 FISH OIL PO) Take by mouth.   Yes [provider]  Probiotic Product (PROBIOTIC DAILY PO) Take by mouth.   Yes [provider]  sitaGLIPtin (JANUVIA) 100 MG tablet Take 100 mg by mouth daily.   Yes [provider]  tamsulosin (FLOMAX)  0.4 MG CAPS capsule Take 0.4 mg by mouth daily.   Yes [provider]  timolol (TIMOPTIC) 0.5 % ophthalmic solution Place 1 drop into both eyes every evening.    Yes [provider]  insulin glargine (LANTUS) 100 UNIT/ML injection Inject 70 Units into the skin at bedtime.  Patient not taking: Reported on 12/19/2019    [provider]  oxyCODONE (OXY IR/ROXICODONE) 5 MG immediate release tablet Take 1-2 tablets (5-10 mg total) by mouth every 3 (three) hours as needed for breakthrough pain. Patient not taking: Reported on 12/19/2019 10/29/16   Newman Pies, MD  triamcinolone cream (KENALOG) 0.1 % Apply 1 application topically as needed (for rash).     [provider]    Allergies as of 11/17/2019 - Review Complete 10/30/2016  Allergen Reaction Noted  . Pilocarpine hcl Other (See Comments) 06/28/2015    Family History  Problem Relation Age of Onset  . Pancreatic cancer Mother   . Liver cancer Father     Social History   Socioeconomic History  . Marital status: Married    Spouse name: Not on file  . Number of children: Not on file  . Years of education: Not on file  . Highest education level: Not on file  Occupational History  . Not on file  Tobacco Use  . Smoking status: Former Smoker    Quit date: 01/07/1984    Years since quitting: 35.9  . Smokeless tobacco: Never Used  Vaping Use  . Vaping Use: Never used  Substance and Sexual Activity  . Alcohol use: Yes    Alcohol/week: 14.0 standard drinks    Types: 14 Glasses of wine per week    Comment:    . Drug use: No  . Sexual activity: Not on file  Other Topics Concern  . Not on file  Social History Narrative  . Not on file   Social Determinants of Health   Financial Resource Strain: Not on file  Food Insecurity: Not on file  Transportation Needs: Not on file  Physical Activity: Not on file  Stress: Not on file  Social Connections: Not on file  Intimate Partner Violence: Not on  file    Review of Systems: See HPI, otherwise negative ROS  Physical Exam: BP (!) 141/69   Pulse 61   Temp (!) 97 F (36.1 C) (Temporal)   Resp 18   Ht 5\' 5"  (1.651 m)   Wt 103 kg   SpO2 98%   BMI 37.77 kg/m  General:   Alert,  pleasant and cooperative in NAD Head:  Normocephalic and atraumatic. Respiratory:  Normal work of breathing.  Impression/Plan: BERNADETTE ARMIJO is here for cataract surgery.  Risks, benefits, limitations, and alternatives regarding cataract surgery have been reviewed with the patient.  Questions have been answered.  All parties agreeable.   Benay Pillow, MD  12/25/2019, 10:18 AM

## 2020-01-01 ENCOUNTER — Encounter: Payer: Self-pay | Admitting: Ophthalmology

## 2020-01-01 NOTE — Anesthesia Preprocedure Evaluation (Addendum)
Anesthesia Evaluation  Patient identified by MRN, date of birth, ID band Patient awake    Reviewed: Allergy & Precautions, H&P , NPO status , Patient's Chart, lab work & pertinent test results  Airway Mallampati: I  TM Distance: >3 FB Neck ROM: full    Dental no notable dental hx.    Pulmonary sleep apnea , former smoker,    Pulmonary exam normal        Cardiovascular hypertension, On Medications Normal cardiovascular exam Rhythm:regular Rate:Normal     Neuro/Psych negative neurological ROS     GI/Hepatic negative GI ROS, Neg liver ROS,   Endo/Other  diabetes, Well Controlled, Type 2, Insulin Dependent  Renal/GU   negative genitourinary   Musculoskeletal   Abdominal   Peds  Hematology negative hematology ROS (+)   Anesthesia Other Findings   Reproductive/Obstetrics                            Anesthesia Physical Anesthesia Plan  ASA: II  Anesthesia Plan: MAC   Post-op Pain Management:    Induction:   PONV Risk Score and Plan: 1 and Treatment may vary due to age or medical condition  Airway Management Planned:   Additional Equipment:   Intra-op Plan:   Post-operative Plan:   Informed Consent: I have reviewed the patients History and Physical, chart, labs and discussed the procedure including the risks, benefits and alternatives for the proposed anesthesia with the patient or authorized representative who has indicated his/her understanding and acceptance.       Plan Discussed with:   Anesthesia Plan Comments:         Anesthesia Quick Evaluation

## 2020-01-09 ENCOUNTER — Other Ambulatory Visit
Admission: RE | Admit: 2020-01-09 | Discharge: 2020-01-09 | Disposition: A | Discharge status: Home / Self Care | Payer: Medicare PPO | Source: Ambulatory Visit | Attending: Ophthalmology | Admitting: Ophthalmology

## 2020-01-09 ENCOUNTER — Other Ambulatory Visit: Payer: Self-pay

## 2020-01-09 DIAGNOSIS — Z01812 Encounter for preprocedural laboratory examination: Secondary | ICD-10-CM | POA: Insufficient documentation

## 2020-01-09 DIAGNOSIS — Z20822 Contact with and (suspected) exposure to covid-19: Secondary | ICD-10-CM | POA: Insufficient documentation

## 2020-01-09 NOTE — Discharge Instructions (Signed)

## 2020-01-10 LAB — SARS CORONAVIRUS 2 (TAT 6-24 HRS): SARS Coronavirus 2: NEGATIVE

## 2020-01-11 ENCOUNTER — Encounter: Payer: Self-pay | Admitting: Ophthalmology

## 2020-01-11 ENCOUNTER — Ambulatory Visit: Payer: Medicare PPO | Admitting: Anesthesiology

## 2020-01-11 ENCOUNTER — Other Ambulatory Visit: Payer: Self-pay

## 2020-01-11 ENCOUNTER — Encounter
Admission: RE | Disposition: A | Discharge status: Home / Self Care | Payer: Self-pay | Source: Home / Self Care | Attending: Ophthalmology

## 2020-01-11 ENCOUNTER — Ambulatory Visit
Admission: RE | Admit: 2020-01-11 | Discharge: 2020-01-11 | Disposition: A | Discharge status: Home / Self Care | Payer: Medicare PPO | Attending: Ophthalmology | Admitting: Ophthalmology

## 2020-01-11 DIAGNOSIS — Z888 Allergy status to other drugs, medicaments and biological substances status: Secondary | ICD-10-CM | POA: Diagnosis not present

## 2020-01-11 DIAGNOSIS — Z79899 Other long term (current) drug therapy: Secondary | ICD-10-CM | POA: Insufficient documentation

## 2020-01-11 DIAGNOSIS — E1139 Type 2 diabetes mellitus with other diabetic ophthalmic complication: Secondary | ICD-10-CM | POA: Diagnosis not present

## 2020-01-11 DIAGNOSIS — H42 Glaucoma in diseases classified elsewhere: Secondary | ICD-10-CM | POA: Diagnosis not present

## 2020-01-11 DIAGNOSIS — H2512 Age-related nuclear cataract, left eye: Secondary | ICD-10-CM | POA: Insufficient documentation

## 2020-01-11 DIAGNOSIS — H2181 Floppy iris syndrome: Secondary | ICD-10-CM | POA: Diagnosis not present

## 2020-01-11 DIAGNOSIS — Z800 Family history of malignant neoplasm of digestive organs: Secondary | ICD-10-CM | POA: Insufficient documentation

## 2020-01-11 DIAGNOSIS — Z87891 Personal history of nicotine dependence: Secondary | ICD-10-CM | POA: Insufficient documentation

## 2020-01-11 DIAGNOSIS — E1142 Type 2 diabetes mellitus with diabetic polyneuropathy: Secondary | ICD-10-CM | POA: Diagnosis not present

## 2020-01-11 DIAGNOSIS — H401121 Primary open-angle glaucoma, left eye, mild stage: Secondary | ICD-10-CM | POA: Insufficient documentation

## 2020-01-11 DIAGNOSIS — Z794 Long term (current) use of insulin: Secondary | ICD-10-CM | POA: Diagnosis not present

## 2020-01-11 DIAGNOSIS — Z791 Long term (current) use of non-steroidal anti-inflammatories (NSAID): Secondary | ICD-10-CM | POA: Diagnosis not present

## 2020-01-11 HISTORY — PX: CATARACT EXTRACTION W/PHACO: SHX586

## 2020-01-11 LAB — GLUCOSE, CAPILLARY: Glucose-Capillary: 255 mg/dL — ABNORMAL HIGH (ref 70–99)

## 2020-01-11 SURGERY — PHACOEMULSIFICATION, CATARACT, WITH IOL INSERTION
Anesthesia: Monitor Anesthesia Care | Site: Eye | Laterality: Left

## 2020-01-11 MED ORDER — MOXIFLOXACIN HCL 0.5 % OP SOLN
OPHTHALMIC | Status: DC | PRN
  Administered 2020-01-11: 0.2 mL via OPHTHALMIC

## 2020-01-11 MED ORDER — SODIUM HYALURONATE 10 MG/ML IO SOLN
INTRAOCULAR | Status: DC | PRN
  Administered 2020-01-11: 0.55 mL via INTRAOCULAR

## 2020-01-11 MED ORDER — EPINEPHRINE PF 1 MG/ML IJ SOLN
INTRAOCULAR | Status: DC | PRN
  Administered 2020-01-11: 12:00:00 86 mL via OPHTHALMIC

## 2020-01-11 MED ORDER — MIDAZOLAM HCL 2 MG/2ML IJ SOLN
INTRAMUSCULAR | Status: DC | PRN
  Administered 2020-01-11 (×2): 1 mg via INTRAVENOUS

## 2020-01-11 MED ORDER — TETRACAINE HCL 0.5 % OP SOLN
1.0000 [drp] | OPHTHALMIC | Status: DC | PRN
  Administered 2020-01-11 (×3): 1 [drp] via OPHTHALMIC

## 2020-01-11 MED ORDER — LIDOCAINE HCL (PF) 2 % IJ SOLN
INTRAOCULAR | Status: DC | PRN
  Administered 2020-01-11: 11:00:00 1 mL via INTRAOCULAR

## 2020-01-11 MED ORDER — FENTANYL CITRATE (PF) 100 MCG/2ML IJ SOLN
INTRAMUSCULAR | Status: DC | PRN
  Administered 2020-01-11: 50 ug via INTRAVENOUS

## 2020-01-11 MED ORDER — LACTATED RINGERS IV SOLN: INTRAVENOUS | Status: DC

## 2020-01-11 MED ORDER — ARMC OPHTHALMIC DILATING DROPS
1.0000 "application " | OPHTHALMIC | Status: DC | PRN
  Administered 2020-01-11 (×3): 1 via OPHTHALMIC

## 2020-01-11 MED ORDER — SODIUM HYALURONATE 23 MG/ML IO SOLN
INTRAOCULAR | Status: DC | PRN
  Administered 2020-01-11: 0.6 mL via INTRAOCULAR

## 2020-01-11 SURGICAL SUPPLY — 22 items
CANNULA ANT/CHMB 27GA (MISCELLANEOUS) ×4 IMPLANT
DEVICE INJECT ISTENT W (Stent) ×1 IMPLANT
DISSECTOR HYDRO NUCLEUS 50X22 (MISCELLANEOUS) ×2 IMPLANT
GLOVE SURG LX 7.5 STRW (GLOVE) ×2
GLOVE SURG LX STRL 7.5 STRW (GLOVE) ×2 IMPLANT
GLOVE SURG SYN 8.5  E (GLOVE) ×1
GLOVE SURG SYN 8.5 E (GLOVE) ×1 IMPLANT
GOWN STRL REUS W/ TWL LRG LVL3 (GOWN DISPOSABLE) ×2 IMPLANT
GOWN STRL REUS W/TWL LRG LVL3 (GOWN DISPOSABLE) ×4
ICLIP (OPHTHALMIC RELATED) ×2 IMPLANT
INJECT ISTENT W (Stent) ×2 IMPLANT
LENS IOL IQ PAN TRC 30 13.5 ×1 IMPLANT
LENS IOL PANOP TORIC 30 13.5 ×1 IMPLANT
LENS IOL PANOPTIX TORIC 13.5 ×2 IMPLANT
MARKER SKIN DUAL TIP RULER LAB (MISCELLANEOUS) ×2 IMPLANT
PACK DR. KING ARMS (PACKS) ×2 IMPLANT
PACK EYE AFTER SURG (MISCELLANEOUS) ×2 IMPLANT
PACK OPTHALMIC (MISCELLANEOUS) ×2 IMPLANT
SYR 3ML LL SCALE MARK (SYRINGE) ×2 IMPLANT
SYR TB 1ML LUER SLIP (SYRINGE) ×2 IMPLANT
WATER STERILE IRR 250ML POUR (IV SOLUTION) ×2 IMPLANT
WIPE NON LINTING 3.25X3.25 (MISCELLANEOUS) ×2 IMPLANT

## 2020-01-11 NOTE — Transfer of Care (Signed)
Immediate Anesthesia Transfer of Care Note  Patient: Miguel White  Procedure(s) Performed: CATARACT EXTRACTION PHACO AND INTRAOCULAR LENS PLACEMENT (IOC) LEFT DIABETIC ISTENT INJ PANOPTIX (Left Eye)  Patient Location: PACU  Anesthesia Type: MAC  Level of Consciousness: awake, alert  and patient cooperative  Airway and Oxygen Therapy: Patient Spontanous Breathing and Patient connected to supplemental oxygen  Post-op Assessment: Post-op Vital signs reviewed, Patient's Cardiovascular Status Stable, Respiratory Function Stable, Patent Airway and No signs of Nausea or vomiting  Post-op Vital Signs: Reviewed and stable  Complications: No complications documented.

## 2020-01-11 NOTE — H&P (Signed)
[facility]   Primary Care Physician:  Kandyce Rud, MD Ophthalmologist: Dr. Willey Blade  Pre-Procedure History & Physical: HPI:  Miguel White is a 74 y.o. male here for cataract surgery.   Past Medical History:  Diagnosis Date   Allergic rhinitis    DDD (degenerative disc disease), cervical    Depressive disorder    Diabetes mellitus without complication ([facility])    Diabetic peripheral neuropathy ([facility])    Glaucoma    History of chickenpox    History of claustrophobia    HNP (herniated nucleus pulposus with myelopathy), thoracic    Hyperlipidemia    Hypertension    Hypertrophy of prostate without urinary obstruction    Lumbar radiculitis    Lumbar stenosis    Motion sickness    back seat of cars   Obesity    OSA on CPAP    Shingles     Past Surgical History:  Procedure Laterality Date   BROW LIFT Bilateral 05/26/2016   Procedure: BLEPHAROPLASTY UPPER EYELID W/EXCESS SKIN;  Surgeon: Imagene Riches, MD;  Location: [facility];  Service: Ophthalmology;  Laterality: Bilateral;   CATARACT EXTRACTION W/PHACO Right 12/25/2019   Procedure: CATARACT EXTRACTION PHACO AND INTRAOCULAR LENS PLACEMENT (IOC) RIGHT PANOPTIX ISTENT INJ DIABETIC;  Surgeon: Nevada Crane, MD;  Location: [facility];  Service: Ophthalmology;  Laterality: Right;  4.01 0:45.4   CERVICAL FUSION  2012   COLONOSCOPY WITH PROPOFOL N/A 07/01/2015   Procedure: COLONOSCOPY WITH PROPOFOL;  Surgeon: Scot Jun, MD;  Location: [facility] END[facility];  Service: Endoscopy;  Laterality: N/A;   DECOMPRESSIVE LUMBAR LAMINECTOMY LEVEL 4  01/20/2016   [facility], Dr. Terrilee Files   LUMBAR LAMINECTOMY/DECOMPRESSION MICRODISCECTOMY Bilateral 10/28/2016   Procedure: LAMINECTOMY BILATERAL LUMBAR 4- LUMBAR 5;  Surgeon: Tressie Stalker, MD;  Location: [facility] OR;  Service: Neurosurgery;  Laterality: Bilateral;  LAMINECTOMY FOR FACET/SYNOVIAL CYST BILATERAL LUMBAR 4- LUMBAR 5   POSTERIOR  CERVICAL FUSION/FORAMINOTOMY  2012   PTOSIS REPAIR Bilateral 05/26/2016   Procedure: PTOSIS REPAIR RESECT EX;  Surgeon: Imagene Riches, MD;  Location: [facility];  Service: Ophthalmology;  Laterality: Bilateral;  Diabetic - insulin and oral meds sleep apnea   TONSILLECTOMY      Prior to Admission medications   Medication Sig Start Date End Date Taking? Authorizing Provider  amLODipine-benazepril (LOTREL) 10-20 MG capsule Take 1 capsule by mouth daily.    Yes [provider]  Ascorbic Acid (VITAMIN C PO) Take 1,000 mg by mouth daily.    Yes [provider]  BIOTIN PO Take by mouth daily.   Yes [provider]  cetirizine (ZYRTEC) 10 MG tablet Take 10 mg by mouth daily.    Yes [provider]  citalopram (CELEXA) 20 MG tablet Take 20 mg by mouth daily.   Yes [provider]  ezetimibe-simvastatin (VYTORIN) 10-20 MG tablet Take 1 tablet by mouth daily at 6 PM.    Yes [provider]  fluticasone (FLONASE) 50 MCG/ACT nasal spray Place 2 sprays into both nostrils daily as needed for allergies.    Yes [provider]  glimepiride (AMARYL) 4 MG tablet Take 4 mg by mouth daily with breakfast.   Yes [provider]  hydrochlorothiazide (HYDRODIURIL) 25 MG tablet Take 25 mg by mouth every evening.    Yes [provider]  ibuprofen (ADVIL,MOTRIN) 200 MG tablet Take 400 mg by mouth every 6 (six) hours as needed for headache or moderate pain.   Yes [provider]  insulin glargine (LANTUS) 100 UNIT/ML injection Inject 70 Units into the skin at bedtime.   Yes [provider]  latanoprost (XALATAN) 0.005 % ophthalmic solution Place 1 drop into both eyes at bedtime.    Yes [provider]  Lifitegrast Shirley Friar) 5 % SOLN Apply to eye 2 (two) times daily.   Yes [provider]  metFORMIN (GLUCOPHAGE) 1000 MG tablet Take 1,000 mg by mouth 2 (two) times daily with a meal.   Yes [provider]  Multiple Vitamin (MULTIVITAMIN) tablet Take 1 tablet by mouth daily.   Yes [provider]  Omega-3 Fatty Acids (OMEGA-3 FISH OIL PO) Take by mouth.   Yes [provider]  Probiotic Product (PROBIOTIC DAILY PO) Take by mouth.   Yes [provider]  sitaGLIPtin (JANUVIA) 100 MG tablet Take 100 mg by mouth daily.   Yes [provider]  tamsulosin (FLOMAX) 0.4 MG CAPS capsule Take 0.4 mg by mouth daily.   Yes [provider]  timolol (TIMOPTIC) 0.5 % ophthalmic solution Place 1 drop into both eyes every evening.    Yes [provider]  triamcinolone cream (KENALOG) 0.1 % Apply 1 application topically as needed (for rash).    Yes [provider]  oxyCODONE (OXY IR/ROXICODONE) 5 MG immediate release tablet Take 1-2 tablets (5-10 mg total) by mouth every 3 (three) hours as needed for breakthrough pain. Patient not taking: Reported on 12/19/2019 10/29/16   Newman Pies, MD    Allergies as of 12/27/2019 - Review Complete 12/25/2019  Allergen Reaction Noted   Pilocarpine hcl Other (See Comments) 06/28/2015    Family History  Problem Relation Age of Onset   Pancreatic cancer Mother    Liver cancer Father     Social History   Socioeconomic History   Marital status: Married    Spouse name: Not on file   Number of children: Not on file   Years of education: Not on file   Highest education level: Not on file  Occupational History   Not on file  Tobacco Use   Smoking status: Former Smoker    Quit date: 01/07/1984    Years since quitting: 36.0   Smokeless tobacco: Never Used  Vaping Use   Vaping Use: Never used  Substance and Sexual Activity   Alcohol use: Yes    Alcohol/week: 14.0 standard drinks    Types: 14 Glasses of wine per week    Comment:     Drug use: No   Sexual activity: Not on file  Other Topics Concern   Not on file  Social History Narrative   Not on file   Social  Determinants of Health   Financial Resource Strain: Not on file  Food Insecurity: Not on file  Transportation Needs: Not on file  Physical Activity: Not on file  Stress: Not on file  Social Connections: Not on file  Intimate Partner Violence: Not on file    Review of Systems: See HPI, otherwise negative ROS  Physical Exam: BP (!) 150/69    Pulse 67    Temp 97.8 F (36.6 C) (Temporal)    Ht 5\' 5"  (1.651 m)    Wt 99.3 kg    SpO2 99%    BMI 36.44 kg/m  General:   Alert,  pleasant and cooperative in NAD Head:  Normocephalic and atraumatic. Respiratory:  Normal work of breathing.  Impression/Plan: Miguel White is here for cataract surgery.  Risks, benefits, limitations, and  alternatives regarding cataract surgery have been reviewed with the patient.  Questions have been answered.  All parties agreeable.   Benay Pillow, MD  01/11/2020, 10:43 AM

## 2020-01-11 NOTE — Op Note (Signed)
OPERATIVE NOTE  Miguel White [id number] 01/11/2020  PREOPERATIVE DIAGNOSIS:   1.  Mild  PRIMARY open angle glaucoma, left eye. P32.9518  2.  Nuclear sclerotic cataract left eye.  H25.12   POSTOPERATIVE DIAGNOSIS:     1.  Mild  PRIMARY open angle glaucoma, left eye. A41.6606  2.  Nuclear sclerotic cataract left eye.  H25.12 3.  Intraoperative floppy iris syndrome.    PROCEDURE:   1.  Placement of trabecular bypass stent (istent). CPT 0191T  and placement of additional stent, LEFT EYE  CPT 0376T 2.  Phacoemusification with posterior chamber intraocular lens placement of the left eye  CPT [phone]   LENS: Implant Name Type Inv. Item Serial No. Manufacturer Lot No. LRB No. Used Action  LENS IOL PANOPTIX TORIC 13.5 - [id number]  LENS IOL PANOPTIX TORIC 13.5 [id number] [facility]  Left 1 Implanted  Marko Stai - [id number] Stent INJECT [employer] [id number] Left 1 Implanted      Procedure(s) with comments: CATARACT EXTRACTION PHACO AND INTRAOCULAR LENS PLACEMENT (IOC) LEFT DIABETIC ISTENT INJ PANOPTIX (Left) - 6.11 0:55.7  TFNT30 +13.5   ULTRASOUND TIME: 0 minutes 55.7seconds.  CDE 6.11   SURGEON:  Benay Pillow, MD, MPH  ANESTHESIOLOGIST: Anesthesiologist: Elgie Collard, MD CRNA: Cameron Ali, CRNA   ANESTHESIA:  MAC and intracameral preservative-free intracameral lidocaine 4%.  ESTIMATED BLOOD LOSS: less than 1 mL.   COMPLICATIONS:  None.   DESCRIPTION OF PROCEDURE:  The patient was identified in the holding room and transported to the operating room.  The patient was placed in the supine position under the operating microscope.  The left eye was prepped and draped in the usual sterile ophthalmic fashion.  The verion was registered without difficulty.   A 1.0 millimeter clear-corneal paracentesis was made at the 4:30 position. 0.5 ml of preservative-free 1% lidocaine with epinephrine was injected into the anterior chamber.  The anterior chamber was  filled with Healon 5 viscoelastic.  A 2.4 millimeter keratome was used to make a near-clear corneal incision at the 2:00 position.   Attention was turned to the istent.  The patients head was turned to the left and the microscope was tilted to 035 degrees.  Ocular instruments/Glaukos OAL/H2 gonioprism was used with IPC05 (iclip) coupled with Healon 5 on the cornea was used to visualize the trabecular meshwork. The istent was opened and introduced into the eye.  The meshwork was engaged with the tip of the iStent injector and the stent was deployed into Schlemm's canal at 10:30.  The second stent was deployed at 8:00.  The stents were well seated and in good position.  Next, attention was turned to the phacoemulsification A curvilinear capsulorrhexis was made with a cystotome and capsulorrhexis forceps.  Balanced salt solution was used to hydrodissect and hydrodelineate the nucleus.   Phacoemulsification was then used in stop and chop fashion to remove the lens nucleus and epinucleus.  The remaining cortex was then removed using the irrigation and aspiration handpiece. Healon was then placed into the capsular bag to distend it for lens placement.  A lens was then injected into the capsular bag.  The remaining viscoelastic was aspirated.  The lens was rotated with guidance from the Rupert system.   Wounds were hydrated with balanced salt solution.  The anterior chamber was inflated to a physiologic pressure with balanced salt solution.   Intracameral vigamox 0.1 mL undiluted was injected into the eye and a drop placed onto the ocular surface.  There was blood reflux from both istent locations.  No wound leaks were noted.  Protective glasses were placed on the patient.  The patient was taken to the recovery room in stable condition without complications of anesthesia or surgery   Benay Pillow 01/11/2020, 11:50 AM

## 2020-01-11 NOTE — Anesthesia Postprocedure Evaluation (Signed)
Anesthesia Post Note  Patient: Miguel White  Procedure(s) Performed: CATARACT EXTRACTION PHACO AND INTRAOCULAR LENS PLACEMENT (IOC) LEFT DIABETIC ISTENT INJ PANOPTIX (Left Eye)     Patient location during evaluation: PACU Anesthesia Type: MAC Level of consciousness: awake and alert Pain management: pain level controlled Vital Signs Assessment: post-procedure vital signs reviewed and stable Respiratory status: spontaneous breathing Cardiovascular status: stable Anesthetic complications: no   No complications documented.  Gillian Scarce

## 2020-01-11 NOTE — Anesthesia Procedure Notes (Signed)
Procedure Name: MAC Date/Time: 01/11/2020 11:20 AM Performed by: Cameron Ali, CRNA Pre-anesthesia Checklist: Patient identified, Emergency Drugs available, Suction available, Timeout performed and Patient being monitored Patient Re-evaluated:Patient Re-evaluated prior to induction Oxygen Delivery Method: Nasal cannula Placement Confirmation: positive ETCO2

## 2020-05-31 ENCOUNTER — Other Ambulatory Visit (HOSPITAL_COMMUNITY): Payer: Self-pay | Admitting: Family Medicine

## 2020-05-31 ENCOUNTER — Other Ambulatory Visit: Payer: Self-pay | Admitting: Family Medicine

## 2020-05-31 DIAGNOSIS — R222 Localized swelling, mass and lump, trunk: Secondary | ICD-10-CM

## 2020-06-07 ENCOUNTER — Other Ambulatory Visit: Payer: Self-pay

## 2020-06-07 ENCOUNTER — Ambulatory Visit (HOSPITAL_COMMUNITY)
Admission: RE | Admit: 2020-06-07 | Discharge: 2020-06-07 | Disposition: A | Discharge status: Home / Self Care | Payer: Medicare PPO | Source: Ambulatory Visit | Attending: Family Medicine | Admitting: Family Medicine

## 2020-06-07 DIAGNOSIS — R222 Localized swelling, mass and lump, trunk: Secondary | ICD-10-CM | POA: Insufficient documentation

## 2022-08-27 ENCOUNTER — Other Ambulatory Visit: Payer: Self-pay | Admitting: Orthopedic Surgery

## 2022-08-27 DIAGNOSIS — M4802 Spinal stenosis, cervical region: Secondary | ICD-10-CM

## 2022-08-27 DIAGNOSIS — M47812 Spondylosis without myelopathy or radiculopathy, cervical region: Secondary | ICD-10-CM

## 2022-08-27 DIAGNOSIS — M5412 Radiculopathy, cervical region: Secondary | ICD-10-CM

## 2022-08-27 DIAGNOSIS — M4322 Fusion of spine, cervical region: Secondary | ICD-10-CM

## 2022-08-28 ENCOUNTER — Ambulatory Visit
Admission: RE | Admit: 2022-08-28 | Discharge: 2022-08-28 | Disposition: A | Discharge status: Home / Self Care | Payer: Medicare PPO | Source: Ambulatory Visit | Attending: Orthopedic Surgery | Admitting: Orthopedic Surgery

## 2022-08-28 DIAGNOSIS — M4322 Fusion of spine, cervical region: Secondary | ICD-10-CM | POA: Insufficient documentation

## 2022-08-28 DIAGNOSIS — M47812 Spondylosis without myelopathy or radiculopathy, cervical region: Secondary | ICD-10-CM | POA: Diagnosis present

## 2022-08-28 DIAGNOSIS — M5412 Radiculopathy, cervical region: Secondary | ICD-10-CM

## 2022-08-28 DIAGNOSIS — M4802 Spinal stenosis, cervical region: Secondary | ICD-10-CM

## 2023-03-23 IMAGING — US US SOFT TISSUE
1 series · 12 of 12 positions shown · non-contrast
Comparison: None.

CLINICAL DATA: Palpable area near the sternum

EXAM:
ULTRASOUND OF CHEST SOFT TISSUES
TECHNIQUE: Ultrasound examination of the chest wall soft tissues was performed
in the area of clinical concern.

[Series 1: us soft tissue · 12 acquisitions, 12 frames shown]
[im 1/12]
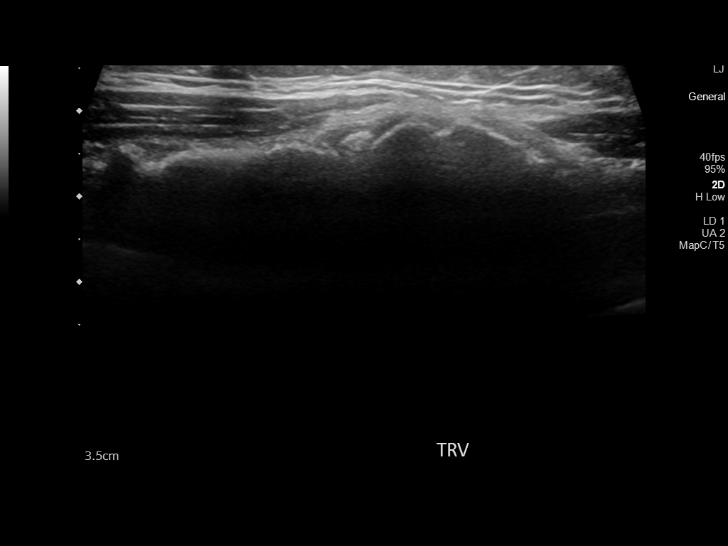
[im 2/12]
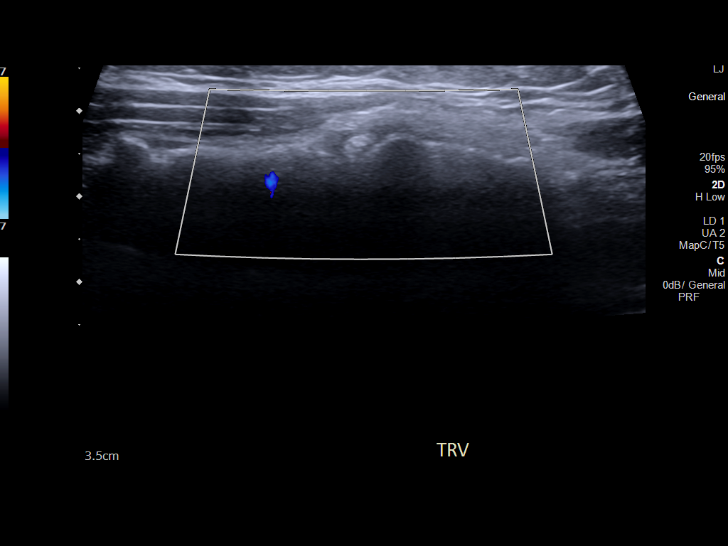
[im 3/12]
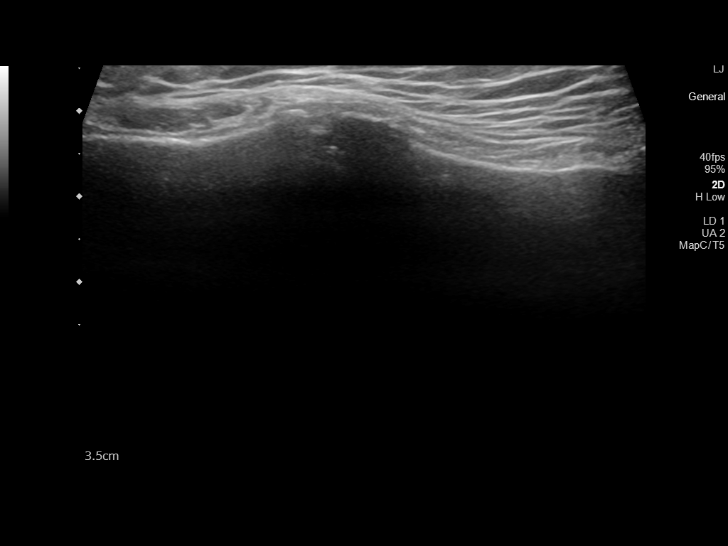
[im 4/12]
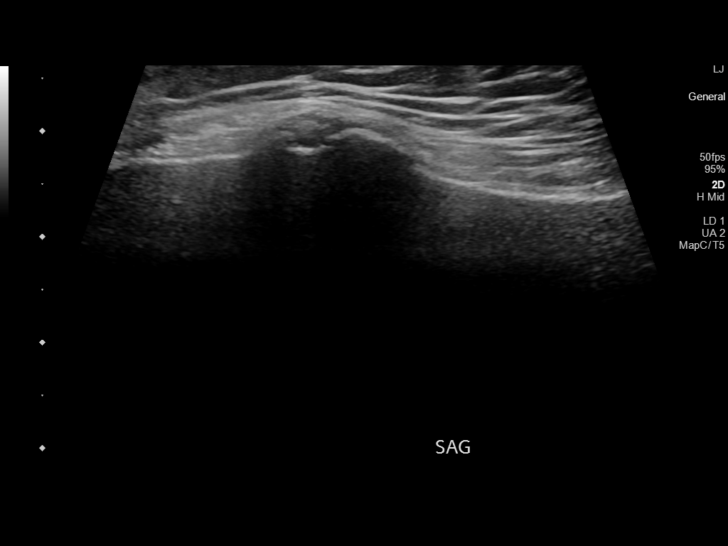
[im 5/12]
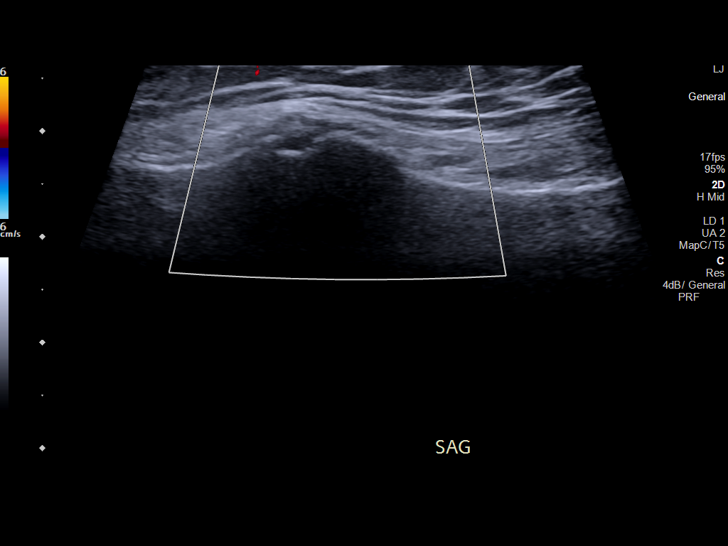
[im 6/12]
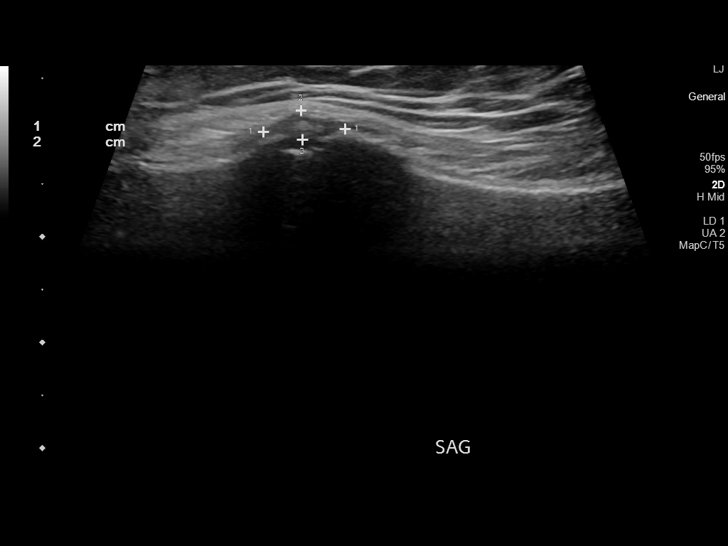
[im 7/12]
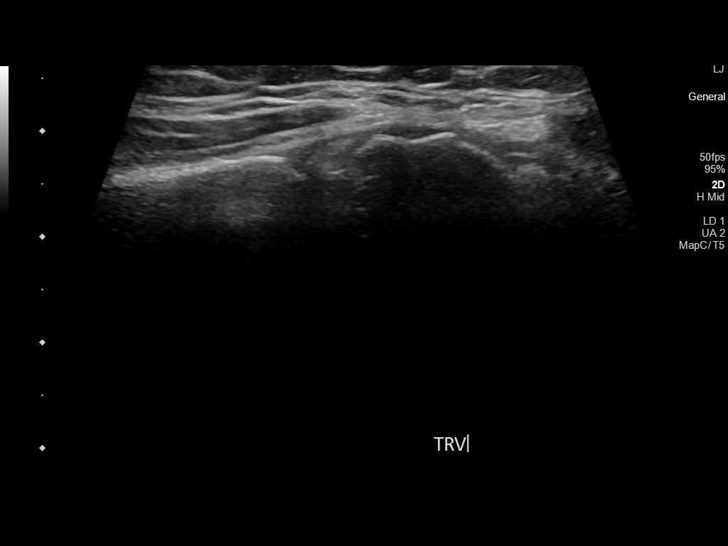
[im 8/12]
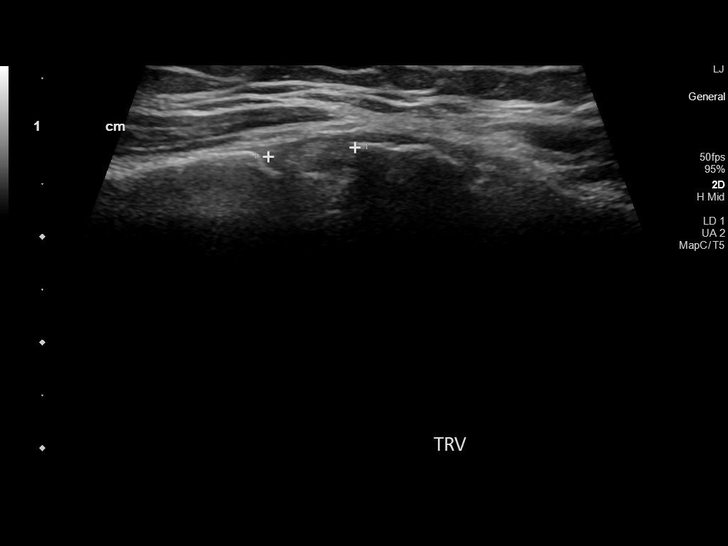
[im 9/12]
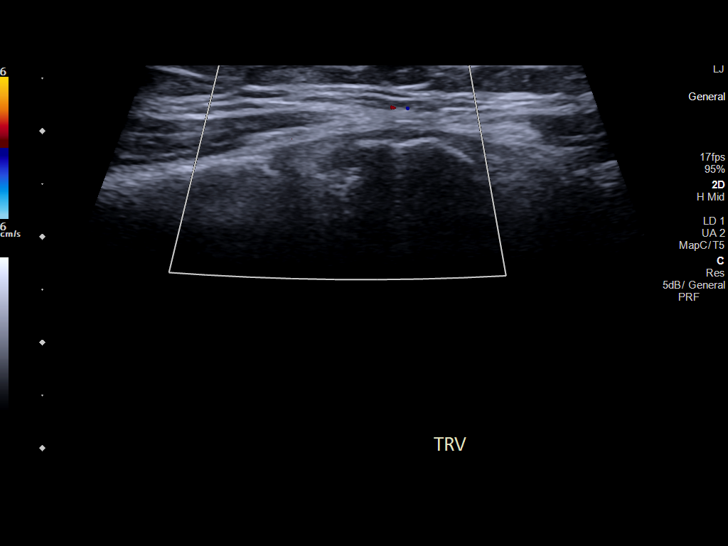
[im 10/12]
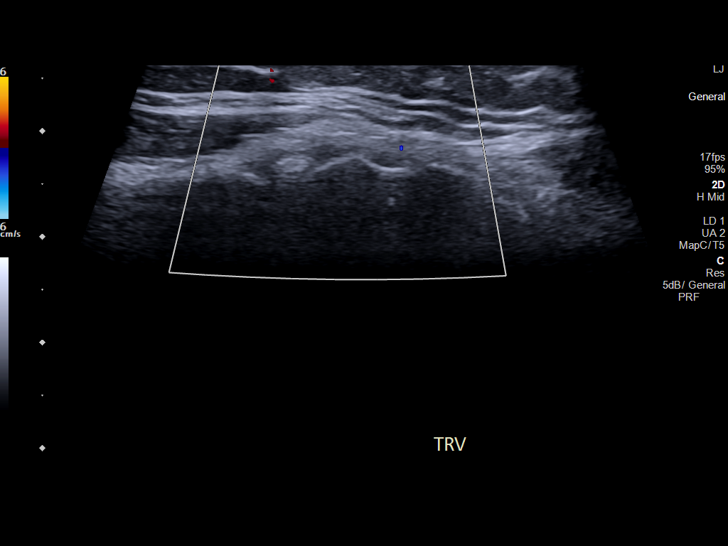
[im 11/12]
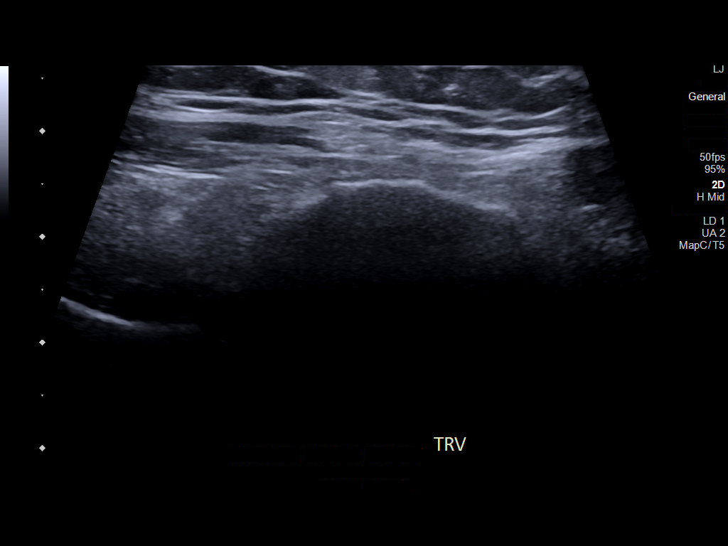
[im 12/12]
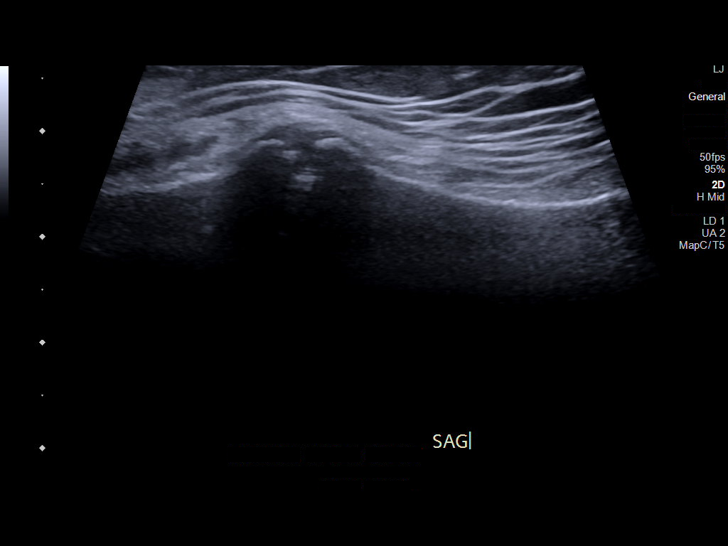

[12 of 12 positions shown; findings below may reference images not displayed]

FINDINGS: Targeted ultrasound was performed of the site of palpable concern.
No suspicious cystic or solid mass is seen. Subjacent to the
pectoral muscle, there is a heterogeneous predominantly echogenic
area which likely reflects prominent costochondral
cartilage/calcifications. It appears contiguous with the adjacent
rib. No increased vascularity.
IMPRESSION: There is likely prominent costochondral cartilage at the site of
palpable concern. If persistent concern, this could be confirmed
with dedicated noncontrast chest CT with overlying skin marker at
site of palpable concern.
